# Patient Record
Sex: Female | Born: 1965
Health system: Southern US, Community
[De-identification: ages and names within clinical notes are randomized; demographics above are authoritative.]

## PROBLEM LIST (undated history)

## (undated) DIAGNOSIS — E039 Hypothyroidism, unspecified: Secondary | ICD-10-CM

## (undated) DIAGNOSIS — E079 Disorder of thyroid, unspecified: Secondary | ICD-10-CM

## (undated) HISTORY — DX: Disorder of thyroid, unspecified: E07.9

## (undated) HISTORY — PX: TUBAL LIGATION: SHX77

## (undated) HISTORY — PX: WISDOM TOOTH EXTRACTION: SHX21

---

## 1999-03-30 ENCOUNTER — Other Ambulatory Visit: Admission: RE | Admit: 1999-03-30 | Discharge: 1999-03-30 | Payer: Self-pay | Admitting: Obstetrics and Gynecology

## 2001-02-09 ENCOUNTER — Other Ambulatory Visit: Admission: RE | Admit: 2001-02-09 | Discharge: 2001-02-09 | Payer: Self-pay | Admitting: Obstetrics and Gynecology

## 2001-02-09 ENCOUNTER — Encounter (INDEPENDENT_AMBULATORY_CARE_PROVIDER_SITE_OTHER): Payer: Self-pay | Admitting: Specialist

## 2001-02-22 ENCOUNTER — Encounter: Payer: Self-pay | Admitting: Obstetrics and Gynecology

## 2001-02-22 ENCOUNTER — Encounter (INDEPENDENT_AMBULATORY_CARE_PROVIDER_SITE_OTHER): Payer: Self-pay

## 2001-02-22 ENCOUNTER — Ambulatory Visit (HOSPITAL_COMMUNITY): Admission: AD | Admit: 2001-02-22 | Discharge: 2001-02-22 | Payer: Self-pay | Admitting: Obstetrics and Gynecology

## 2002-12-27 ENCOUNTER — Other Ambulatory Visit: Admission: RE | Admit: 2002-12-27 | Discharge: 2002-12-27 | Payer: Self-pay | Admitting: Obstetrics and Gynecology

## 2007-04-19 ENCOUNTER — Encounter: Admission: RE | Admit: 2007-04-19 | Discharge: 2007-04-19 | Payer: Self-pay | Admitting: Family Medicine

## 2008-09-30 ENCOUNTER — Encounter: Admission: RE | Admit: 2008-09-30 | Discharge: 2008-09-30 | Payer: Self-pay | Admitting: Family Medicine

## 2011-01-22 NOTE — Op Note (Signed)
[facility]  Patient:    Kathleen Huffman, Kathleen Huffman                       MRN: [id number] Proc. Date: 02/22/01 Adm. Date:  [id number] Attending:  Maxie Better                           Operative Report  DATE OF BIRTH:                12-Jun-1966  PREOPERATIVE DIAGNOSIS:       Left ectopic pregnancy, probably ruptured. Desires tubal sterilization.  POSTOPERATIVE DIAGNOSES:      1. Left ectopic pregnancy, probably ruptured.                                  Desires tubal sterilization.                               2. Hemoperitoneum.                               3. Confirmed ruptured left tubal pregnancy.  ANESTHESIOLOGIST:             Octaviano Glow. Pamalee Leyden, M.D.  OPERATION:                    Left salpingectomy by laparoscopy with right tubal sterilization by cauterization.  SURGEON:                      Genia Del, M.D.  ASSISTANT:                    Sheronette A. Cherly Hensen, M.D.  DESCRIPTION OF PROCEDURE:     Under general anesthesia with endotracheal intubation and the patient in the lithotomy position, she was prepped with Betadine in the abdominal, suprapubic, vulvar and vaginal areas.  Bladder catheterization was done and the patient was draped as usual.  Vaginal exam revealed an anteverted uterus of normal volume.  No right adnexal mass.  On the left side, a mass measuring about 4 x 5 cm was felt.  The cervix and vagina were normal.  A speculum was introduced.  The anterior lip of the cervix was grasped with a tenaculum and the uterus was cannulated.  We removed the speculum.  Abdominally, an infraumbilical incision was done with a scalpel over 10 mm.  A Veress needle was introduced while raising the abdominal wall. Security tests were done and a pneumoperitoneum was created with approximately 3.5 L of CO2.  Once achieved, the Veress needle was removed.  The trocar and laparoscope were introduced.  The suprapubic area was free.  A  contralateral incision was done at that level over 10 mm with a scalpel.  A 10 mm trocar was introduced.  A probe was inserted.  Inspection of the abdominal cavity revealed no pathology.  Inspection of the pelvic cavity revealed hemoperitoneum of about 250 cc.  The uterus was normal in volume and appearance.  The right tube and the right ovary were normal.  The left adnexa presented a big blood clot on the left tube. No active bleeding was seen.  We removed the probe and inserted the large Nezhat.  We irrigated  and suctioned the pelvic cavity.  The clot on the left tube was suctioned.  We then saw a ruptured left ampullary tubal pregnancy measuring about 4 cm in diameter and a partially ruptured tube.  We used the tripolar to cauterize and cut successively the tube at the cornua.  We then followed under the tube and the mesosalpinx unto the tube with the ectopic pregnancy was completely removed. We used an Endobag and the specimen was then sent to pathology.  We then irrigated and suctioned again.  Hemostasis was adequate at the level of the left adnexa.  We then proceeded with the right tubal sterilization using the tripolar, cauterizing the tube at about 2 cm from the cornua over a length of about 2 cm and reaching the mesosalpinx.  We then irrigated for the last time and suctioned.  Hemostasis was adequate.  Pictures were taken before and after the surgery.  We then removed the instruments and evacuated the CO2. Estimated hemoperitoneum present at the beginning of surgery was 250 cc. Estimated blood loss during the case was about 50 cc, for a total of 300 cc. No complications occurred.  The patient was sent to the recovery room in good status.  Rh is pending.  If Rh negative, RhoGAM will be given. DD:  02/22/01 TD:  02/22/01 Job: [id number]

## 2015-01-07 ENCOUNTER — Ambulatory Visit: Payer: Self-pay | Admitting: Family Medicine

## 2015-02-07 ENCOUNTER — Ambulatory Visit: Payer: Self-pay | Admitting: Family Medicine

## 2015-02-27 LAB — VITAMIN B12: VITAMIN B12: 1455

## 2015-02-27 LAB — VITAMIN D 25 HYDROXY (VIT D DEFICIENCY, FRACTURES): Vit D, 25-Hydroxy: 59

## 2015-02-27 LAB — THYROXINE (T4) FREE, DIRECT: THYROXINE (T4): 0.88

## 2015-02-27 LAB — T3, FREE: Triiodothyronine, Free, Serum: 4

## 2015-02-27 LAB — HEMOGLOBIN A1C: Hgb A1c MFr Bld: 5.4 % (ref 4.0–6.0)

## 2015-03-17 ENCOUNTER — Ambulatory Visit: Payer: Self-pay | Admitting: Family Medicine

## 2015-03-21 ENCOUNTER — Encounter: Payer: Self-pay | Admitting: Family Medicine

## 2015-03-21 ENCOUNTER — Ambulatory Visit (INDEPENDENT_AMBULATORY_CARE_PROVIDER_SITE_OTHER): Payer: BLUE CROSS/BLUE SHIELD | Admitting: Family Medicine

## 2015-03-21 VITALS — BP 109/67 | HR 71 | Ht 63.5 in | Wt 178.0 lb

## 2015-03-21 DIAGNOSIS — E039 Hypothyroidism, unspecified: Secondary | ICD-10-CM

## 2015-03-21 DIAGNOSIS — E6609 Other obesity due to excess calories: Secondary | ICD-10-CM | POA: Insufficient documentation

## 2015-03-21 DIAGNOSIS — R2230 Localized swelling, mass and lump, unspecified upper limb: Secondary | ICD-10-CM | POA: Insufficient documentation

## 2015-03-21 DIAGNOSIS — R2231 Localized swelling, mass and lump, right upper limb: Secondary | ICD-10-CM | POA: Diagnosis not present

## 2015-03-21 DIAGNOSIS — E669 Obesity, unspecified: Secondary | ICD-10-CM

## 2015-03-21 LAB — TSH: TSH: 0.241 u[IU]/mL — ABNORMAL LOW (ref 0.350–4.500)

## 2015-03-21 LAB — LDL CHOLESTEROL, DIRECT: Direct LDL: 153 mg/dL — ABNORMAL HIGH

## 2015-03-21 NOTE — Assessment & Plan Note (Signed)
Check TSH 

## 2015-03-21 NOTE — Assessment & Plan Note (Signed)
Direct LDL, CMP labs pending.

## 2015-03-21 NOTE — Progress Notes (Signed)
Kathleen Huffman is a 49 y.o. female who [facility] [place] for establish care. She feels well with medical problems as well as significant for thyroid disease managed by integrative health with natural thyroid hormone. She does however note any acute problem. She is a several month history of a small mass in her right axilla. This is not changed in months. A friend of hers who is a PA thinks it's a lipoma. She denies any weight loss fevers chills night sweats nausea vomiting or diarrhea. She has not tried any treatment yet.   History reviewed. No pertinent past medical history. Past Surgical History  Procedure Laterality Date  . Tubal ligation     History  Substance Use Topics  . Smoking status: Former Games developermoker  . Smokeless tobacco: Not on file  . Alcohol Use: 0.0 oz/week    0 Standard drinks or equivalent per week   ROS as above Medications: Current Outpatient Prescriptions  Medication Sig Dispense Refill  . AMBULATORY NON FORMULARY MEDICATION pronegnolene    . B Complex Vitamins (VITAMIN B COMPLEX PO) Take by mouth.    . Cyanocobalamin (VITAMIN B-12 PO) Take by mouth.    . Magnesium 100 MG CAPS Take by mouth.    . THYROID PO Take by mouth.     No current facility-administered medications for this visit.   Allergies  Allergen Reactions  . Sulfa Antibiotics Hives     Exam:  BP 109/67 mmHg  Pulse 71  Ht 5' 3.5" (1.613 m)  Wt 178 lb (80.74 kg)  BMI 31.03 kg/m2 Gen: Well NAD HEENT: EOMI,  MMM Lungs: Normal work of breathing. CTABL Heart: RRR no MRG Abd: NABS, Soft. Nondistended, Nontender Exts: Brisk capillary refill, warm and well perfused.  Lymph: No cervical lymphadenopathy bilaterally. No left axillary lymphadenopathy. Right axilla small mobile nontender mass less than 1 cm. No skin changes.  No results found for this or any previous visit (from the past 24 hour(s)). No results found.   Please see individual assessment  and plan sections.

## 2015-03-21 NOTE — Assessment & Plan Note (Signed)
Likely lipoma. Watchful waiting is changing we'll perform ultrasound and excisional biopsy.

## 2015-03-21 NOTE — Patient Instructions (Signed)
Thank you for coming in today. Return in one year. Return sooner if the mass changes. Call or go to the emergency room if you get worse, have trouble breathing, have chest pains, or palpitations.

## 2015-06-06 ENCOUNTER — Encounter: Payer: Self-pay | Admitting: Family Medicine

## 2015-06-09 ENCOUNTER — Ambulatory Visit (INDEPENDENT_AMBULATORY_CARE_PROVIDER_SITE_OTHER): Payer: BLUE CROSS/BLUE SHIELD | Admitting: Family Medicine

## 2015-06-09 ENCOUNTER — Encounter: Payer: Self-pay | Admitting: Family Medicine

## 2015-06-09 VITALS — BP 135/82 | HR 69 | Temp 98.1°F | Wt 177.0 lb

## 2015-06-09 DIAGNOSIS — E669 Obesity, unspecified: Secondary | ICD-10-CM | POA: Diagnosis not present

## 2015-06-09 DIAGNOSIS — R599 Enlarged lymph nodes, unspecified: Secondary | ICD-10-CM

## 2015-06-09 DIAGNOSIS — R590 Localized enlarged lymph nodes: Secondary | ICD-10-CM

## 2015-06-09 DIAGNOSIS — E039 Hypothyroidism, unspecified: Secondary | ICD-10-CM

## 2015-06-09 NOTE — Progress Notes (Signed)
Kathleen Huffman is a 49 y.o. female who presents to [name]  today for lymph node. Patient has a swollen nodule on the right posterior scalp. This is been present for 10 days. It is not particularly tender and she denies any other scalp issues. She feels well otherwise with no fevers chills nausea vomiting or diarrhea. She feels well with no issues today.   Past Medical History  Diagnosis Date  . Thyroid disease    Past Surgical History  Procedure Laterality Date  . Tubal ligation     Social History  Substance Use Topics  . Smoking status: Former Games developer  . Smokeless tobacco: Not on file  . Alcohol Use: 0.0 oz/week    0 Standard drinks or equivalent per week   family history includes Alcohol abuse in her brother and father; Cancer in her maternal grandmother; Diabetes in her maternal uncle; Hyperlipidemia in her father and maternal grandfather; Hypertension in her mother.  ROS as above Medications: Current Outpatient Prescriptions  Medication Sig Dispense Refill  . AMBULATORY NON FORMULARY MEDICATION pronegnolene    . B Complex Vitamins (VITAMIN B COMPLEX PO) Take by mouth.    . Cyanocobalamin (VITAMIN B-12 PO) Take by mouth.    . Magnesium 100 MG CAPS Take by mouth.    . THYROID PO Take by mouth.     No current facility-administered medications for this visit.   Allergies  Allergen Reactions  . Sulfa Antibiotics Hives     Exam:  BP 135/82 mmHg  Pulse 69  Temp(Src) 98.1 F (36.7 C) (Oral)  Wt 177 lb (80.287 kg) Gen: Well NAD HEENT: EOMI,  MMM no scalp lesions Lungs: Normal work of breathing. CTABL Heart: RRR no MRG Abd: NABS, Soft. Nondistended, Nontender Exts: Brisk capillary refill, warm and well perfused.  Skin: Small mobile nontender less than 1 cm nodule at the posterior scalp consistent with lymph node  No results found for this or any previous visit (from the past 24 hour(s)). No results found.   Please see  individual assessment and plan sections.

## 2015-06-09 NOTE — Assessment & Plan Note (Signed)
Likely cervical lymph node. Plan for CBC and watchful waiting.

## 2015-06-09 NOTE — Assessment & Plan Note (Signed)
TSH lower last visit. Recheck TSH today.

## 2015-06-09 NOTE — Patient Instructions (Signed)
Thank you for coming in today. Get fasting labs in the near future.  Return if the mass is not getting better or is getting worse.  I will call with results.

## 2015-06-09 NOTE — Assessment & Plan Note (Signed)
Tract LDL elevated last time. Recheck fasting labs today.

## 2015-06-13 LAB — CBC WITH DIFFERENTIAL/PLATELET
BASOS ABS: 0.1 10*3/uL (ref 0.0–0.1)
BASOS PCT: 1 % (ref 0–1)
EOS ABS: 0.1 10*3/uL (ref 0.0–0.7)
EOS PCT: 2 % (ref 0–5)
HCT: 42.4 % (ref 36.0–46.0)
Hemoglobin: 14.5 g/dL (ref 12.0–15.0)
Lymphocytes Relative: 38 % (ref 12–46)
Lymphs Abs: 2.6 10*3/uL (ref 0.7–4.0)
MCH: 31.5 pg (ref 26.0–34.0)
MCHC: 34.2 g/dL (ref 30.0–36.0)
MCV: 92 fL (ref 78.0–100.0)
MPV: 10.5 fL (ref 8.6–12.4)
Monocytes Absolute: 0.8 10*3/uL (ref 0.1–1.0)
Monocytes Relative: 12 % (ref 3–12)
Neutro Abs: 3.2 10*3/uL (ref 1.7–7.7)
Neutrophils Relative %: 47 % (ref 43–77)
PLATELETS: 274 10*3/uL (ref 150–400)
RBC: 4.61 MIL/uL (ref 3.87–5.11)
RDW: 13.1 % (ref 11.5–15.5)
WBC: 6.8 10*3/uL (ref 4.0–10.5)

## 2015-06-13 LAB — COMPREHENSIVE METABOLIC PANEL
ALK PHOS: 58 U/L (ref 33–115)
ALT: 11 U/L (ref 6–29)
AST: 14 U/L (ref 10–35)
Albumin: 4.5 g/dL (ref 3.6–5.1)
BILIRUBIN TOTAL: 0.5 mg/dL (ref 0.2–1.2)
BUN: 17 mg/dL (ref 7–25)
CO2: 24 mmol/L (ref 20–31)
Calcium: 9.7 mg/dL (ref 8.6–10.2)
Chloride: 100 mmol/L (ref 98–110)
Creat: 0.8 mg/dL (ref 0.50–1.10)
GLUCOSE: 84 mg/dL (ref 65–99)
Potassium: 4.5 mmol/L (ref 3.5–5.3)
Sodium: 136 mmol/L (ref 135–146)
Total Protein: 7.5 g/dL (ref 6.1–8.1)

## 2015-06-13 LAB — LIPID PANEL
Cholesterol: 218 mg/dL — ABNORMAL HIGH (ref 125–200)
HDL: 41 mg/dL — ABNORMAL LOW (ref >=46)
LDL CALC: 145 mg/dL — AB (ref <130)
Total CHOL/HDL Ratio: 5.3 Ratio — ABNORMAL HIGH (ref <=5.0)
Triglycerides: 160 mg/dL — ABNORMAL HIGH (ref <150)
VLDL: 32 mg/dL — ABNORMAL HIGH (ref <30)

## 2015-06-13 LAB — TSH: TSH: 0.392 u[IU]/mL (ref 0.350–4.500)

## 2015-06-16 MED ORDER — ATORVASTATIN CALCIUM 40 MG PO TABS: 40.0000 mg | ORAL_TABLET | Freq: Every day | ORAL | Status: DC

## 2015-06-16 NOTE — Progress Notes (Signed)
Quick Note:  Cholesterol is very bad. Start Lipitor. Recheck in 1 month. ______

## 2015-06-16 NOTE — Addendum Note (Signed)
Addended by: Rodolph Bong on: 06/16/2015 08:11 AM   Modules accepted: Orders

## 2015-11-27 ENCOUNTER — Encounter (HOSPITAL_COMMUNITY): Payer: Self-pay | Admitting: *Deleted

## 2015-11-27 ENCOUNTER — Other Ambulatory Visit: Payer: Self-pay | Admitting: Obstetrics and Gynecology

## 2015-12-10 ENCOUNTER — Encounter (HOSPITAL_COMMUNITY)
Admission: RE | Disposition: A | Discharge status: Home / Self Care | Payer: Self-pay | Source: Ambulatory Visit | Attending: Obstetrics and Gynecology

## 2015-12-10 ENCOUNTER — Ambulatory Visit (HOSPITAL_COMMUNITY): Payer: BLUE CROSS/BLUE SHIELD | Admitting: Anesthesiology

## 2015-12-10 ENCOUNTER — Ambulatory Visit (HOSPITAL_COMMUNITY)
Admission: RE | Admit: 2015-12-10 | Discharge: 2015-12-10 | Disposition: A | Discharge status: Home / Self Care | Payer: BLUE CROSS/BLUE SHIELD | Source: Ambulatory Visit | Attending: Obstetrics and Gynecology | Admitting: Obstetrics and Gynecology

## 2015-12-10 ENCOUNTER — Encounter (HOSPITAL_COMMUNITY): Payer: Self-pay

## 2015-12-10 DIAGNOSIS — N939 Abnormal uterine and vaginal bleeding, unspecified: Secondary | ICD-10-CM | POA: Insufficient documentation

## 2015-12-10 DIAGNOSIS — Z882 Allergy status to sulfonamides status: Secondary | ICD-10-CM | POA: Insufficient documentation

## 2015-12-10 DIAGNOSIS — Z87891 Personal history of nicotine dependence: Secondary | ICD-10-CM | POA: Diagnosis not present

## 2015-12-10 DIAGNOSIS — E039 Hypothyroidism, unspecified: Secondary | ICD-10-CM | POA: Insufficient documentation

## 2015-12-10 HISTORY — DX: Hypothyroidism, unspecified: E03.9

## 2015-12-10 HISTORY — PX: DILATATION & CURETTAGE/HYSTEROSCOPY WITH MYOSURE: SHX6511

## 2015-12-10 LAB — CBC
HCT: 41.1 % (ref 36.0–46.0)
HEMOGLOBIN: 14 g/dL (ref 12.0–15.0)
MCH: 31.9 pg (ref 26.0–34.0)
MCHC: 34.1 g/dL (ref 30.0–36.0)
MCV: 93.6 fL (ref 78.0–100.0)
PLATELETS: 344 10*3/uL (ref 150–400)
RBC: 4.39 MIL/uL (ref 3.87–5.11)
RDW: 13.3 % (ref 11.5–15.5)
WBC: 7.1 10*3/uL (ref 4.0–10.5)

## 2015-12-10 SURGERY — DILATATION & CURETTAGE/HYSTEROSCOPY WITH MYOSURE
Anesthesia: General | Site: Vagina

## 2015-12-10 MED ORDER — PROPOFOL 10 MG/ML IV BOLUS
INTRAVENOUS | Status: AC
  Filled 2015-12-10: qty 20

## 2015-12-10 MED ORDER — KETOROLAC TROMETHAMINE 30 MG/ML IJ SOLN: 30.0000 mg | Freq: Once | INTRAMUSCULAR | Status: DC

## 2015-12-10 MED ORDER — BUPIVACAINE HCL (PF) 0.5 % IJ SOLN
INTRAMUSCULAR | Status: AC
Start: 2015-12-10 — End: 2015-12-10
  Filled 2015-12-10: qty 30

## 2015-12-10 MED ORDER — VASOPRESSIN 20 UNIT/ML IV SOLN
INTRAVENOUS | Status: AC
  Filled 2015-12-10: qty 1

## 2015-12-10 MED ORDER — SODIUM CHLORIDE 0.9 % IR SOLN
Status: DC | PRN
  Administered 2015-12-10: 3000 mL

## 2015-12-10 MED ORDER — KETOROLAC TROMETHAMINE 30 MG/ML IJ SOLN
INTRAMUSCULAR | Status: AC
  Filled 2015-12-10: qty 1

## 2015-12-10 MED ORDER — SODIUM CHLORIDE 0.9 % IJ SOLN
INTRAMUSCULAR | Status: DC | PRN
  Administered 2015-12-10: 10 mL via INTRAVENOUS

## 2015-12-10 MED ORDER — ONDANSETRON HCL 4 MG/2ML IJ SOLN
INTRAMUSCULAR | Status: AC
  Filled 2015-12-10: qty 2

## 2015-12-10 MED ORDER — KETOROLAC TROMETHAMINE 30 MG/ML IJ SOLN
INTRAMUSCULAR | Status: DC | PRN
  Administered 2015-12-10: 30 mg via INTRAVENOUS

## 2015-12-10 MED ORDER — LACTATED RINGERS IV SOLN
INTRAVENOUS | Status: DC
  Administered 2015-12-10: 10 mL/h via INTRAVENOUS
  Administered 2015-12-10: 13:00:00 via INTRAVENOUS

## 2015-12-10 MED ORDER — VASOPRESSIN 20 UNIT/ML IV SOLN
INTRAVENOUS | Status: DC | PRN
  Administered 2015-12-10: 20 [IU]

## 2015-12-10 MED ORDER — CEFAZOLIN SODIUM-DEXTROSE 2-3 GM-% IV SOLR
INTRAVENOUS | Status: AC
  Filled 2015-12-10: qty 50

## 2015-12-10 MED ORDER — LIDOCAINE HCL (CARDIAC) 20 MG/ML IV SOLN
INTRAVENOUS | Status: DC | PRN
  Administered 2015-12-10: 50 mg via INTRAVENOUS

## 2015-12-10 MED ORDER — SCOPOLAMINE 1 MG/3DAYS TD PT72
1.0000 | MEDICATED_PATCH | Freq: Once | TRANSDERMAL | Status: DC
  Administered 2015-12-10: 1.5 mg via TRANSDERMAL

## 2015-12-10 MED ORDER — FENTANYL CITRATE (PF) 100 MCG/2ML IJ SOLN
INTRAMUSCULAR | Status: AC
  Filled 2015-12-10: qty 2

## 2015-12-10 MED ORDER — BUPIVACAINE HCL (PF) 0.25 % IJ SOLN
INTRAMUSCULAR | Status: DC | PRN
  Administered 2015-12-10: 20 mL

## 2015-12-10 MED ORDER — SODIUM CHLORIDE 0.9 % IJ SOLN
INTRAMUSCULAR | Status: AC
  Filled 2015-12-10: qty 50

## 2015-12-10 MED ORDER — ONDANSETRON HCL 4 MG/2ML IJ SOLN
INTRAMUSCULAR | Status: DC | PRN
  Administered 2015-12-10: 4 mg via INTRAVENOUS

## 2015-12-10 MED ORDER — DEXAMETHASONE SODIUM PHOSPHATE 10 MG/ML IJ SOLN
INTRAMUSCULAR | Status: DC | PRN
  Administered 2015-12-10: 4 mg via INTRAVENOUS

## 2015-12-10 MED ORDER — OXYCODONE-ACETAMINOPHEN 5-325 MG PO TABS: 1.0000 | ORAL_TABLET | ORAL | Status: DC | PRN

## 2015-12-10 MED ORDER — HYDROMORPHONE HCL 1 MG/ML IJ SOLN: 0.2500 mg | INTRAMUSCULAR | Status: DC | PRN

## 2015-12-10 MED ORDER — MIDAZOLAM HCL 2 MG/2ML IJ SOLN
INTRAMUSCULAR | Status: DC | PRN
  Administered 2015-12-10: 2 mg via INTRAVENOUS

## 2015-12-10 MED ORDER — FENTANYL CITRATE (PF) 100 MCG/2ML IJ SOLN
INTRAMUSCULAR | Status: DC | PRN
  Administered 2015-12-10: 50 ug via INTRAVENOUS

## 2015-12-10 MED ORDER — BUPIVACAINE HCL (PF) 0.25 % IJ SOLN
INTRAMUSCULAR | Status: AC
Start: 2015-12-10 — End: 2015-12-10
  Filled 2015-12-10: qty 30

## 2015-12-10 MED ORDER — CEFAZOLIN SODIUM-DEXTROSE 2-4 GM/100ML-% IV SOLN
2.0000 g | INTRAVENOUS | Status: AC
  Administered 2015-12-10: 2 g via INTRAVENOUS
  Filled 2015-12-10: qty 100

## 2015-12-10 MED ORDER — SCOPOLAMINE 1 MG/3DAYS TD PT72
MEDICATED_PATCH | TRANSDERMAL | Status: AC
  Administered 2015-12-10: 1.5 mg via TRANSDERMAL
  Filled 2015-12-10: qty 1

## 2015-12-10 MED ORDER — MIDAZOLAM HCL 2 MG/2ML IJ SOLN
INTRAMUSCULAR | Status: AC
  Filled 2015-12-10: qty 2

## 2015-12-10 MED ORDER — DEXAMETHASONE SODIUM PHOSPHATE 4 MG/ML IJ SOLN
INTRAMUSCULAR | Status: AC
  Filled 2015-12-10: qty 1

## 2015-12-10 MED ORDER — HYDROCODONE-ACETAMINOPHEN 7.5-325 MG PO TABS: 1.0000 | ORAL_TABLET | Freq: Once | ORAL | Status: DC | PRN

## 2015-12-10 MED ORDER — PROPOFOL 10 MG/ML IV BOLUS
INTRAVENOUS | Status: DC | PRN
  Administered 2015-12-10: 200 mg via INTRAVENOUS

## 2015-12-10 SURGICAL SUPPLY — 19 items
CANISTER SUCT 3000ML (MISCELLANEOUS) ×2 IMPLANT
CATH ROBINSON RED A/P 16FR (CATHETERS) ×2 IMPLANT
CLOTH BEACON ORANGE TIMEOUT ST (SAFETY) ×2 IMPLANT
CONTAINER PREFILL 10% NBF 60ML (FORM) ×4 IMPLANT
DEVICE MYOSURE LITE (MISCELLANEOUS) IMPLANT
DEVICE MYOSURE REACH (MISCELLANEOUS) ×2 IMPLANT
FILTER ARTHROSCOPY CONVERTOR (FILTER) ×2 IMPLANT
GLOVE BIO SURGEON STRL SZ7.5 (GLOVE) ×2 IMPLANT
GLOVE BIOGEL PI IND STRL 7.0 (GLOVE) ×1 IMPLANT
GLOVE BIOGEL PI INDICATOR 7.0 (GLOVE) ×1
GOWN STRL REUS W/TWL LRG LVL3 (GOWN DISPOSABLE) ×6 IMPLANT
PACK VAGINAL MINOR WOMEN LF (CUSTOM PROCEDURE TRAY) ×2 IMPLANT
PAD OB MATERNITY 4.3X12.25 (PERSONAL CARE ITEMS) ×2 IMPLANT
SEAL ROD LENS SCOPE MYOSURE (ABLATOR) ×2 IMPLANT
SYR TB 1ML 25GX5/8 (SYRINGE) IMPLANT
TOWEL OR 17X24 6PK STRL BLUE (TOWEL DISPOSABLE) ×4 IMPLANT
TUBING AQUILEX INFLOW (TUBING) ×2 IMPLANT
TUBING AQUILEX OUTFLOW (TUBING) ×2 IMPLANT
WATER STERILE IRR 1000ML POUR (IV SOLUTION) ×2 IMPLANT

## 2015-12-10 NOTE — Transfer of Care (Signed)
Immediate Anesthesia Transfer of Care Note  Patient: Kathleen MonarchKimberly L Samson  Procedure(s) Performed: Procedure(s): DILATATION & CURETTAGE/HYSTEROSCOPY WITH MYOSURE (N/A)  Patient Location: PACU  Anesthesia Type:General  Level of Consciousness: awake, alert  and oriented  Airway & Oxygen Therapy: Patient Spontanous Breathing and Patient connected to nasal cannula oxygen  Post-op Assessment: Report given to RN and Post -op Vital signs reviewed and stable  Post vital signs: Reviewed and stable  Last Vitals:  Filed Vitals:   12/10/15 1133  BP: 127/76  Pulse: 71  Temp: 36.6 C  Resp: 16    Complications: No apparent anesthesia complications

## 2015-12-10 NOTE — H&P (Signed)
NAMRicharda Osmond:  Sabia, Kelseigh              ACCOUNT NO.:  [id number]  MEDICAL RECORD NO.:  [id number]  LOCATION:  PERIO                         FACILITY:  [facility]  PHYSICIAN:  Lenoard Adenichard J. Misty Foutz, M.D.DATE OF BIRTH:  28-May-1966  DATE OF ADMISSION:  11/26/2015 DATE OF DISCHARGE:                             HISTORY & PHYSICAL   PREOPERATIVE DIAGNOSIS:  Abnormal uterine bleeding.  HISTORY OF PRESENT ILLNESS:  A 50 year old white female, G3, P1, who presents with abnormal bleeding and structural lesions as noted by salines on hysterogram.  ALLERGIES:  She has allergies to SULFA, CODEINE, LATEX.  FAMILY HISTORY:  Bladder cancer, heart disease, lung cancer, hypertension, breast cancer.  SOCIAL HISTORY:  She is a nonsmoker, nondrinker.  She denies domestic or physical violence.  She has a history of vaginal delivery x1.  Ectopic pregnancy treated medically x1.  MEDICATIONS:  Include: 1. Aygestin. 2. Zithromax. 3. Tamiflu. 4. Vitamin D. 5. Thyroid supplement medication.  PHYSICAL EXAMINATION:  GENERAL:  Well-developed, well-nourished white female, in no acute distress. HEENT:  Normal. NECK:  Supple.  Full range of motion. LUNGS:  Clear. HEART:  Regular rate and rhythm. ABDOMEN:  Soft, nontender. PELVIC:  Reveals a normal-sized, slightly bulky uterus with no adnexal masses.  The uterus is retroflexed.  IMPRESSION:  Abnormal uterine bleeding, questionable structural lesion.  PLAN:  Proceed with diagnostic hysteroscopy, D and C, and MyoSure.  The risks of anesthesia, infection, bleeding, injury to surrounding organs, possible need for repair were discussed.  Delayed versus immediate complications to include bowel and bladder injury noted.  The patient acknowledges and wishes to proceed.     Lenoard Adenichard J. Adrick Kestler, M.D.     RJT/MEDQ  D:  12/10/2015  T:  12/10/2015  Job:  [id number]

## 2015-12-10 NOTE — Anesthesia Preprocedure Evaluation (Addendum)
Anesthesia Evaluation  Patient identified by MRN, date of birth, ID band Patient awake    Reviewed: Allergy & Precautions, NPO status , Patient's Chart, lab work & pertinent test results  Airway Mallampati: II  TM Distance: >3 FB Neck ROM: Full    Dental   Pulmonary former smoker,    breath sounds clear to auscultation       Cardiovascular negative cardio ROS   Rhythm:Regular Rate:Normal     Neuro/Psych negative neurological ROS     GI/Hepatic negative GI ROS, Neg liver ROS,   Endo/Other  Hypothyroidism   Renal/GU negative Renal ROS     Musculoskeletal negative musculoskeletal ROS (+)   Abdominal   Peds  Hematology negative hematology ROS (+)   Anesthesia Other Findings   Reproductive/Obstetrics                           Lab Results  Component Value Date   WBC 7.1 12/10/2015   HGB 14.0 12/10/2015   HCT 41.1 12/10/2015   MCV 93.6 12/10/2015   PLT 344 12/10/2015   Lab Results  Component Value Date   CREATININE 0.80 06/12/2015   BUN 17 06/12/2015   NA 136 06/12/2015   K 4.5 06/12/2015   CL 100 06/12/2015   CO2 24 06/12/2015    Anesthesia Physical Anesthesia Plan  ASA: II  Anesthesia Plan: General   Post-op Pain Management:    Induction: Intravenous  Airway Management Planned: LMA  Additional Equipment:   Intra-op Plan:   Post-operative Plan: Extubation in OR  Informed Consent: I have reviewed the patients History and Physical, chart, labs and discussed the procedure including the risks, benefits and alternatives for the proposed anesthesia with the patient or authorized representative who has indicated his/her understanding and acceptance.     Plan Discussed with: CRNA  Anesthesia Plan Comments:         Anesthesia Quick Evaluation

## 2015-12-10 NOTE — Progress Notes (Signed)
Patient ID: Kathleen Huffman, female   DOB: Oct 26, 1965, 50 y.o.   MRN: [id number] Patient seen and examined. Consent witnessed and signed. No changes noted. Update completed.

## 2015-12-10 NOTE — Transfer of Care (Signed)
Immediate Anesthesia Transfer of Care Note  Patient: Kathleen Huffman  Procedure(s) Performed: Procedure(s): DILATATION & CURETTAGE/HYSTEROSCOPY WITH MYOSURE (N/A)  Patient Location: PACU  Anesthesia Type:General  Level of Consciousness: awake, alert  and sedated  Airway & Oxygen Therapy: Patient Spontanous Breathing and Patient connected to nasal cannula oxygen  Post-op Assessment: Report given to RN and Post -op Vital signs reviewed and stable  Post vital signs: Reviewed and stable  Last Vitals:  Filed Vitals:   12/10/15 1133  BP: 127/76  Pulse: 71  Temp: 36.6 C  Resp: 16    Complications: No apparent anesthesia complications

## 2015-12-10 NOTE — Op Note (Signed)
12/10/2015  1:45 PM  PATIENT:  Leonette MonarchKimberly L Wogan  50 y.o. female  PRE-OPERATIVE DIAGNOSIS:  Abnormal Uterine Bleeding  POST-OPERATIVE DIAGNOSIS:  Abnormal Uterine Bleeding Endometrial polypoid tissue  PROCEDURE:  Procedure(s): DILATATION & CURETTAGE HYSTEROSCOPY WITH MYOSURE  SURGEON:  Surgeon(s): Olivia Mackieichard Zamirah Denny, MD  ASSISTANTS: none   ANESTHESIA:   local and general  ESTIMATED BLOOD LOSS: * No blood loss amount entered *   DRAINS: none   LOCAL MEDICATIONS USED:  MARCAINE    and Amount: 20 ml  SPECIMEN:  Source of Specimen:  EMC and polypoid tissue  DISPOSITION OF SPECIMEN:  PATHOLOGY  COUNTS:  YES  DICTATION #: [id number]  PLAN OF CARE: DC home  PATIENT DISPOSITION:  PACU - hemodynamically stable.

## 2015-12-10 NOTE — Anesthesia Postprocedure Evaluation (Signed)
Anesthesia Post Note  Patient: Leonette MonarchKimberly L Klippel  Procedure(s) Performed: Procedure(s) (LRB): DILATATION & CURETTAGE/HYSTEROSCOPY WITH MYOSURE (N/A)  Patient location during evaluation: PACU Anesthesia Type: General Level of consciousness: awake and alert Pain management: pain level controlled Vital Signs Assessment: post-procedure vital signs reviewed and stable Respiratory status: spontaneous breathing, nonlabored ventilation, respiratory function stable and patient connected to nasal cannula oxygen Cardiovascular status: blood pressure returned to baseline and stable Postop Assessment: no signs of nausea or vomiting Anesthetic complications: no    Last Vitals:  Filed Vitals:   12/10/15 1445 12/10/15 1525  BP: 118/70 147/82  Pulse: 75 70  Temp: 36.8 C   Resp: 19 20    Last Pain:  Filed Vitals:   12/10/15 1532  PainSc: 2                  Kennieth RadFitzgerald, Dontarious Schaum E

## 2015-12-10 NOTE — Discharge Instructions (Signed)
DISCHARGE INSTRUCTIONS: HYSTEROSCOPY / ENDOMETRIAL ABLATION The following instructions have been prepared to help you care for yourself upon your return home.  May Remove Scop patch on or before 12/13/15.  May take Ibuprofen after 7:30pm today.  May take stool softner while taking narcotic pain medication to prevent constipation.  Drink plenty of water.  Personal hygiene:  Use sanitary pads for vaginal drainage, not tampons.  Shower the day after your procedure.  NO tub baths, pools or Jacuzzis for 2-3 weeks.  Wipe front to back after using the bathroom.  Activity and limitations:  Do NOT drive or operate any equipment for 24 hours. The effects of anesthesia are still present and drowsiness may result.  Do NOT rest in bed all day.  Walking is encouraged.  Walk up and down stairs slowly.  You may resume your normal activity in one to two days or as indicated by your physician. Sexual activity: NO intercourse for at least 2 weeks after the procedure, or as indicated by your Doctor.  Diet: Eat a light meal as desired this evening. You may resume your usual diet tomorrow.  Return to Work: You may resume your work activities in one to two days or as indicated by Therapist, sportsyour Doctor.  What to expect after your surgery: Expect to have vaginal bleeding/discharge for 2-3 days and spotting for up to 10 days. It is not unusual to have soreness for up to 1-2 weeks. You may have a slight burning sensation when you urinate for the first day. Mild cramps may continue for a couple of days. You may have a regular period in 2-6 weeks.  Call your doctor for any of the following:  Excessive vaginal bleeding or clotting, saturating and changing one pad every hour.  Inability to urinate 6 hours after discharge from hospital.  Pain not relieved by pain medication.  Fever of 100.4 F or greater.  Unusual vaginal discharge or odor.  Return to office _________________Call for an appointment  ___________________ Patients signature: ______________________ Nurses signature ________________________  Post Anesthesia Care Unit [phone]

## 2015-12-10 NOTE — Anesthesia Procedure Notes (Signed)
Procedure Name: LMA Insertion Date/Time: 12/10/2015 1:05 PM Performed by: Cleda ClarksBROWDER, Aryannah Mohon R Pre-anesthesia Checklist: Patient identified, Timeout performed, Emergency Drugs available, Suction available and Patient being monitored Patient Re-evaluated:Patient Re-evaluated prior to inductionOxygen Delivery Method: Circle system utilized Preoxygenation: Pre-oxygenation with 100% oxygen Intubation Type: IV induction Ventilation: Mask ventilation without difficulty LMA: LMA inserted LMA Size: 4.0 Tube type: Oral Number of attempts: 1 Placement Confirmation: positive ETCO2 and breath sounds checked- equal and bilateral Tube secured with: Tape Dental Injury: Teeth and Oropharynx as per pre-operative assessment

## 2015-12-11 ENCOUNTER — Encounter (HOSPITAL_COMMUNITY): Payer: Self-pay | Admitting: Obstetrics and Gynecology

## 2015-12-11 NOTE — Op Note (Signed)
NAMRicharda Osmond:  Kenley, Mckynleigh              ACCOUNT NO.:  [id number]  MEDICAL RECORD NO.:  [id number]  LOCATION:  [facility]                          FACILITY:  [facility]  PHYSICIAN:  Lenoard Adenichard J. Thimothy Barretta, M.D.DATE OF BIRTH:  03/01/1966  DATE OF PROCEDURE: DATE OF DISCHARGE:  12/10/2015                              OPERATIVE REPORT   PREOPERATIVE DIAGNOSIS:  Abnormal uterine bleeding with questionable structural lesion.  POSTOPERATIVE DIAGNOSIS:  Abnormal uterine bleeding with questionable structural lesion plus multiple polypoid endometrial areas.  PROCEDURE:  Diagnostic hysteroscopy, D and C, MyoSure resection of endometrial cavity.  SURGEON:  Lenoard Adenichard J. Jessamyn Watterson, MD  ASSISTANT:  None.  ANESTHESIA:  General and local.  ESTIMATED BLOOD LOSS:  Less than 50 mL.  FLUID DEFICIT:  800 mL of saline.  COMPLICATIONS:  None.  DRAINS:  None.  COUNTS:  Correct.  DISPOSITION:  Patient to recovery room in good condition.  BRIEF OPERATIVE NOTE:  After being apprised of risks of anesthesia, infection, bleeding, injury to surrounding organs, possible need for repair, delayed versus immediate complications to include bowel and bladder injury, possible need for repair.  Patient was brought to the operating room, where she was administered general anesthetic without complications, prepped and draped in usual sterile fashion. Catheterized until the bladder was empty.  Exam under anesthesia revealed a bulky retroflexed uterus and no adnexal masses.  Dilute Pitressin solution placed at 3 and 9 o'clock, 20 mL total, at the cervicovaginal junction, dilute Marcaine was placed in the standard fashion 20 mL total 0.25% plain.  At this time, cervix easily dilated up to a #23 Pratt dilator.  Hysteroscope placed.  Visualization reveals thickened endometrium, symmetrically around the cavity, but most prominent along the right anterior fundal area and posterior wall. These areas were resected using the MyoSure REACH in a  standard fashion resecting entire cavity down to the level of the endometrium without difficulty.  Good hemostasis noted.  D and C was performed in a 4- quadrant method using sharp curettage.  Good hemostasis noted.  Pictures taken.  The patient tolerated the procedure well, was awakened, and transferred to recovery in good condition.     Lenoard Adenichard J. Renella Steig, M.D.     RJT/MEDQ  D:  12/10/2015  T:  12/11/2015  Job:  [id number]

## 2016-08-31 LAB — FOLLICLE STIMULATING HORMONE: FSH: 6

## 2016-08-31 LAB — HM MAMMOGRAPHY

## 2016-08-31 LAB — HM PAP SMEAR: HM PAP: NEGATIVE

## 2016-08-31 LAB — TSH: TSH: 1.57 u[IU]/mL (ref 0.41–5.90)

## 2016-10-17 ENCOUNTER — Encounter: Payer: Self-pay | Admitting: Emergency Medicine

## 2016-10-17 ENCOUNTER — Emergency Department
Admission: EM | Admit: 2016-10-17 | Discharge: 2016-10-17 | Disposition: A | Discharge status: Home / Self Care | Payer: BLUE CROSS/BLUE SHIELD | Source: Home / Self Care | Attending: Emergency Medicine | Admitting: Emergency Medicine

## 2016-10-17 ENCOUNTER — Emergency Department (INDEPENDENT_AMBULATORY_CARE_PROVIDER_SITE_OTHER): Payer: BLUE CROSS/BLUE SHIELD

## 2016-10-17 DIAGNOSIS — S86112A Strain of other muscle(s) and tendon(s) of posterior muscle group at lower leg level, left leg, initial encounter: Secondary | ICD-10-CM | POA: Diagnosis not present

## 2016-10-17 DIAGNOSIS — M79661 Pain in right lower leg: Secondary | ICD-10-CM

## 2016-10-17 MED ORDER — IBUPROFEN 800 MG PO TABS: ORAL_TABLET | ORAL | 0 refills | Status: DC

## 2016-10-17 MED ORDER — METAXALONE 800 MG PO TABS: 800.0000 mg | ORAL_TABLET | Freq: Three times a day (TID) | ORAL | 0 refills | Status: DC

## 2016-10-17 MED ORDER — IBUPROFEN 800 MG PO TABS
800.0000 mg | ORAL_TABLET | Freq: Once | ORAL | Status: AC
  Administered 2016-10-17: 800 mg via ORAL

## 2016-10-17 NOTE — ED Triage Notes (Signed)
[facility] she was trying to direct a horse back into the stable. Patient felt a pop and had immediate pain. Rates pain 2/10 and throbbing in nature.

## 2016-10-17 NOTE — ED Provider Notes (Signed)
[facility]    CSN: [id number] Arrival date & time: 10/17/16  1252     History   Chief Complaint Chief Complaint  Patient presents with  . Leg Pain   Here with husband HPI Kathleen Huffman is a 51 y.o. female.   HPI At 12 noon today, she was trying to direct a horseback into the stable. She felt a sudden pop in her right calf and had immediate pain, throbbing without radiation. Intensity is 3 out of 10 at rest, 6 out of 10 with movement. No associated numbness or fever or chills or nausea or vomiting. No chest pain or shortness of breath. 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 Past Medical History:  Diagnosis Date  . Hypothyroidism   . SVD (spontaneous vaginal delivery)    x 1  . Thyroid disease     Patient Active Problem List   Diagnosis Date Noted  . Reactive cervical lymphadenopathy 06/09/2015  . Hypothyroidism 03/21/2015  . Obesity 03/21/2015  . Axillary mass 03/21/2015    Past Surgical History:  Procedure Laterality Date  . DILATATION & CURETTAGE/HYSTEROSCOPY WITH MYOSURE N/A 12/10/2015   Procedure: DILATATION & CURETTAGE/HYSTEROSCOPY WITH MYOSURE;  Surgeon: Olivia Mackie, MD;  Location: [facility];  Service: Gynecology;  Laterality: N/A;  . TUBAL LIGATION    . WISDOM TOOTH EXTRACTION      OB History    No data available       Home Medications    Prior to Admission medications   Medication Sig Start Date End Date Taking? Authorizing Provider  cetirizine (ZYRTEC) 10 MG tablet Take 5 mg by mouth daily.    Historical Provider, MD  Cholecalciferol (VITAMIN D3) 5000 units CAPS Take 1 capsule by mouth daily.    Historical Provider, MD  Cyanocobalamin (VITAMIN B-12 PO) Take 1 tablet by mouth 2 (two) times daily.     Historical Provider, MD  ibuprofen (ADVIL,MOTRIN) 800 MG tablet Take 1 every 8 hours with food as needed for pain 10/17/16   Lajean Manes, MD  metaxalone (SKELAXIN) 800 MG tablet Take 1 tablet (800 mg total) by mouth 3  (three) times daily. As needed for muscle relaxant 10/17/16   Lajean Manes, MD  Omega-3 Fatty Acids (OMEGA 3 PO) Take 1 capsule by mouth daily.    Historical Provider, MD  oxyCODONE-acetaminophen (ROXICET) 5-325 MG tablet Take 1-2 tablets by mouth every 4 (four) hours as needed for severe pain. 12/10/15   Olivia Mackie, MD  thyroid (NATURE-THROID) 65 MG tablet Take 32.5-65 mg by mouth 2 (two) times daily. 65 mg in the morning and 32.5 mg at night    Historical Provider, MD  vitamin C (ASCORBIC ACID) 500 MG tablet Take 500 mg by mouth daily.    Historical Provider, MD    Family History Family History  Problem Relation Age of Onset  . Hypertension Mother   . Alcohol abuse Father   . Hyperlipidemia Father   . Alcohol abuse Brother   . Diabetes Maternal Uncle   . Cancer Maternal Grandmother   . Hyperlipidemia Maternal Grandfather     Social History Social History  Substance Use Topics  . Smoking status: Former Smoker    Packs/day: 1.00    Years: 10.00    Types: Cigarettes    Quit date: 09/06/2000  . Smokeless tobacco: Never Used  . Alcohol use 0.0 oz/week     Comment: occasionally wine     Allergies   Codeine; Sulfa antibiotics; and Latex  Review of Systems Review of Systems  All other systems reviewed and are negative.    Physical Exam Triage Vital Signs ED Triage Vitals  Enc Vitals Group     BP 10/17/16 1414 126/81     Pulse Rate 10/17/16 1414 80     Resp 10/17/16 1414 16     Temp 10/17/16 1414 98 F (36.7 C)     Temp Source 10/17/16 1414 Oral     SpO2 10/17/16 1414 100 %     Weight 10/17/16 1416 180 lb (81.6 kg)     Height 10/17/16 1416 5\' 3"  (1.6 m)     Head Circumference --      Peak Flow --      Pain Score 10/17/16 1417 2     Pain Loc --      Pain Edu? --      Excl. in GC? --    No data found.   Updated Vital Signs BP 126/81 (BP Location: Left Arm)   Pulse 80   Temp 98 F (36.7 C) (Oral)   Resp 16   Ht 5\' 3"  (1.6 m)   Wt 180 lb (81.6 kg)   LMP  10/13/2016   SpO2 100%   BMI 31.89 kg/m    Physical Exam  Constitutional: She is oriented to person, place, and time. She appears well-developed and well-nourished.  In moderate pain from right calf pain. She avoids weightbearing right lower extremity. No cardiorespiratory distress  HENT:  Head: Normocephalic and atraumatic.  Eyes: Pupils are equal, round, and reactive to light. No scleral icterus.  Neck: Normal range of motion. Neck supple.  Cardiovascular: Normal rate and regular rhythm.   Pulmonary/Chest: Effort normal.  Abdominal: She exhibits no distension.  Musculoskeletal:       Right knee: Normal. She exhibits normal range of motion. No tenderness found.       Right ankle: Normal. She exhibits normal range of motion, no swelling and normal pulse. No tenderness. No head of 5th metatarsal and no proximal fibula tenderness found. Achilles tendon normal. Achilles tendon exhibits normal Thompson's test results.       Right lower leg: She exhibits tenderness. She exhibits no laceration.       Legs:      Right foot: Normal. There is normal range of motion, no swelling and normal capillary refill.  Pain in the right medial calf is exacerbated by dorsiflexion and plantar flexion, but range of motion of ankle within normal limits.  Right foot: Normal exam. DP pulses normal. Distal neurovascular intact. Temperature of skin of right foot is normal, equal to the left foot.  Neurological: She is alert and oriented to person, place, and time. No cranial nerve deficit or sensory deficit.  Skin: Skin is warm and dry.  Psychiatric: She has a normal mood and affect. Her behavior is normal.  Vitals reviewed.    UC Treatments / Results  Labs (all labs ordered are listed, but only abnormal results are displayed) Labs Reviewed - No data to display  EKG  EKG Interpretation None       Radiology Dg Tibia/fibula Right  Result Date: 10/17/2016 CLINICAL DATA:  Pt states she was working  with horses today and her right leg gave out. C/o lower leg pain midway down posterior portion of tib/fib. EXAM: RIGHT TIBIA AND FIBULA - 2 VIEW COMPARISON:  None. FINDINGS: There is no evidence of fracture or other focal bone lesions. Soft tissues are unremarkable. IMPRESSION: Negative. Electronically Signed  By: Elige KoHetal  Patel   On: 10/17/2016 13:45    Procedures Procedures (including critical care time)  Medications Ordered in UC Medications  ibuprofen (ADVIL,MOTRIN) tablet 800 mg (800 mg Oral Given 10/17/16 1441)     Initial Impression / Assessment and Plan / UC Course  I have reviewed the triage vital signs and the nursing notes.  Pertinent labs & imaging results that were available during my care of the patient were reviewed by me and considered in my medical decision making (see chart for details).    Final Clinical Impressions(s) / UC Diagnoses   Final diagnoses:  Right calf pain  Gastrocnemius muscle tear, left, initial encounter  X-ray right tib-fib shows no fracture. She likely has a tear of the right gastrocnemius or soleus muscle.  Treatment options discussed, as well as risks, benefits, alternatives. Patient and husband voiced understanding and agreement with the following plans: Here in urgent care, 800 mg ibuprofen given by mouth stat. She declined the option of Toradol IM stat. I offered small amount of tramadol prescription if needed for severe pain, but she declined. She declined any other NSAID prescription other than Motrin, prescribed 800 mg every 8 hours with food if needed for pain. She states the only muscle relaxant that's worked without drowsiness is Skelaxin, so I prescribed Skelaxin, 1 every 8 hours  if needed.  New Prescriptions Discharge Medication List as of 10/17/2016  2:49 PM    START taking these medications   Details  ibuprofen (ADVIL,MOTRIN) 800 MG tablet Take 1 every 8 hours with food as needed for pain, Normal    metaxalone (SKELAXIN) 800  MG tablet Take 1 tablet (800 mg total) by mouth 3 (three) times daily. As needed for muscle relaxant, Starting Sun 10/17/2016, Normal       Encourage rest, ice, compression with ACE bandage, and elevation of injured body part. Ice pack applied. I applied 2 Ace bandages from her ankle up to and including the top of the right lower leg. Long cam walker fitted. Crutches provided. Follow-up with Dr Denyse Amassorey your PCP/sports medicine specialist in 1-2  Days.  Precautions discussed. Red flags discussed. Questions invited and answered. Patient and husband voiced understanding and agreement.       Lajean Manesavid Massey, MD 10/17/16 [phone]

## 2016-10-18 ENCOUNTER — Ambulatory Visit (INDEPENDENT_AMBULATORY_CARE_PROVIDER_SITE_OTHER): Payer: BLUE CROSS/BLUE SHIELD | Admitting: Family Medicine

## 2016-10-18 ENCOUNTER — Encounter: Payer: Self-pay | Admitting: Family Medicine

## 2016-10-18 DIAGNOSIS — S86111A Strain of other muscle(s) and tendon(s) of posterior muscle group at lower leg level, right leg, initial encounter: Secondary | ICD-10-CM | POA: Diagnosis not present

## 2016-10-18 NOTE — Patient Instructions (Signed)
Thank you for coming in today. Continue compression for a few days.  Use cam walker and crutches as needed.  Recheck in 2 weeks or so.    Medial Head Gastrocnemius Tear Medial head gastrocnemius tear, also called tennis leg, is an injury to the inner part of the calf muscle. This injury may include overstretching of the muscle or a partial or complete tear. This is a common sports injury. Calf muscle tears usually occur near the back of the knee. This often causes sudden pain and muscle weakness. What are the causes? This condition is caused by forceful stretching or strain on the calf muscle. This usually happens when you forcefully push off of your foot. It may also happen if you forcefully straighten your knee while your foot is flat on the ground. What increases the risk? The following factors may make you more likely to develop this condition:  Being female and older than age 49.  Playing sports that involve:  Quick increases in speed and changes of direction, such as tennis and soccer.  Jumping, such as basketball.  Running, especially uphill or on uneven ground. What are the signs or symptoms? Symptoms of this condition include:  Sudden pain in the back of the leg when the injury occurs. You may hear a popping sound or feel like you got hit in your calf.  Pain that is made worse by bringing your toes up toward your shin or by straightening your knee.  Pain on the inside of your calf, from your knee to your ankle.  Not being able to rise up on your toes.  Pain when pressing on your calf muscle.  Swelling of your calf.  Bruising along your calf and lower leg, down to your ankle.  Difficulty pushing off your foot when walking or using stairs. How is this diagnosed? This condition may be diagnosed based on:  Your symptoms and medical history.  A physical exam. Your health care provider may be able to feel a lump or a defect in your muscle.  MRI or ultrasound to  determine the severity and exact location of your injury. How is this treated? Treatment for this condition may include:  Resting the muscle and keeping weight off your leg for several days. During this time, you may use crutches or another walking device.  Using a splint to keep your ankle or knee in a stable position.  Wearing a walking boot to decrease the use of your gastrocnemius muscle.  Using a wedge under your heel to reduce stretching of your healing muscle.  Wearing a compression sleeve around your calf muscle.  Icing the muscle.  Raising (elevating) your leg when resting.  Taking medicine for pain and swelling (NSAIDs or steroids).  Doing leg exercises as told by your health care provider or physical therapist. Follow these instructions at home: If you have a splint or compression sleeve:  Wear it as told by your health care provider. Remove it only as told by your health care provider.  Loosen the splint if your toes tingle, become numb, or turn cold and blue.  If your splint or sleeve is not waterproof:  Do not let it get wet.  Protect it with a watertight covering when you take a bath or a shower.  Keep the splint or sleeve clean. Managing pain, stiffness, and swelling  If directed, put ice on the injured area:  Put ice in a plastic bag.  Place a towel between your skin and the  bag.  Leave the ice on for 20 minutes, 2-3 times a day.  Move your toes often to avoid stiffness and to lessen swelling.  Raise (elevate) the injured area above the level of your heart while you are sitting or lying down. Driving  Do not drive or operate heavy machinery while taking prescription pain medicine.  Ask your health care provider when it is safe to drive. Activity  Do not use the injured limb to support your body weight until your health care provider says that you can. Use crutches as told by your health care provider.  Return gradually to your normal  activities as told by your health care provider. Ask your health care provider what activities are safe for you.  Do exercises as told by your health care provider.  Return to sporting activity only as told by your health care provider or physical therapist. Full recovery may take several months. General instructions  Take over-the-counter and prescription medicines only as told by your health care provider.  Keep all follow-up visits as told by your health care provider. This is important. How is this prevented?  Warm up and stretch before being active.  Cool down and stretch after being active.  Give your body time to rest between periods of activity.  Make sure to use equipment that fits you.  Be safe and responsible while being active to avoid falls.  Maintain physical fitness, including:  Strength.  Flexibility. Contact a health care provider if:  Your symptoms do not improve with rest and treatment. Get help right away if:  You have swelling or redness in your calf that is getting worse. This information is not intended to replace advice given to you by your health care provider. Make sure you discuss any questions you have with your health care provider. Document Released: 08/23/2005 Document Revised: 04/27/2016 Document Reviewed: 08/05/2015 Elsevier Interactive Patient Education  2017 [employer]    Medial Head Gastrocnemius Tear Rehab Ask your health care provider which exercises are safe for you. Do exercises exactly as told by your health care provider and adjust them as directed. It is normal to feel mild stretching, pulling, tightness, or discomfort as you do these exercises, but you should stop right away if you feel sudden pain or your pain gets worse.Do not begin these exercises until told by your health care provider. Stretching and range of motion exercises These exercises warm up your muscles and joints and improve the movement and flexibility of your  lower leg. These exercises also help to relieve pain and stiffness. Exercise A: Gastrocnemius stretch 1. Sit with your left / right leg extended. 2. Loop a belt or towel around the ball of your left / right foot. The ball of your foot is on the walking surface, right under your toes. 3. Hold both ends of the belt or towel. 4. Keep your left / right ankle and foot relaxed and keep your knee straight while you use the belt or towel to pull your foot and ankle toward you. Stop at the first point of resistance. 5. Hold this position for __________ seconds. Repeat __________ times. Complete this exercise __________ times a day. Exercise B: Ankle alphabet 1. Sit with your left / right leg supported at the lower leg.  Do not rest your foot on anything.  Make sure your foot has room to move freely. 2. Think of your left / right foot as a paintbrush, and move your foot to trace each letter  of the alphabet in the air. Keep your hip and knee still while you trace. 3. Trace every letter from A to Z. Repeat __________ times. Complete this exercise __________ times a day. Strengthening exercises These exercises build strength and endurance in your lower leg. Endurance is the ability to use your muscles for a long time, even after they get tired. Exercise C: Plantar flexors with band 1. Sit with your left / right leg extended. 2. Loop a rubber exercise band or tube around the ball of your left / right foot. The ball of your foot is on the walking surface, right under your toes. 3. While holding both ends of the band or tube, slowly point your toes downward, pushing them away from you. 4. Hold this position for __________ seconds. 5. Slowly return your foot to the starting position. Repeat __________ times. Complete this exercise __________ times a day. Exercise D: Plantar flexors, standing 1. Stand with your feet shoulder-width apart. 2. Place your hands on a wall or table to steady yourself as needed,  but try not to use it very much for support. 3. Rise up on your toes. 4. If this exercise is too easy, try these options:  Shift your weight toward your left / right leg until you feel challenged.  If told by your health care provider, stand on your left / right foot only. 5. Hold this position for __________ seconds. Repeat __________ times. Complete this exercise __________ times a day. Exercise E: Plantar flexors, eccentric 1. Stand on the balls of your feet on the edge of a step. The ball of your foot is on the walking surface, right under your toes. 2. Place your hands on a wall or railing for balance as needed, but try not to lean on it for support. 3. Rise up on your toes, using both legs to help. 4. Slowly shift all of your weight to your left / right foot and lift your other foot off the step. 5. Slowly lower your left / right heel so it drops below the level of the step. You will feel a slight stretch in your left / right calf. 6. Put your other foot back onto the step. Repeat __________ times. Complete this exercise __________ times a day. This information is not intended to replace advice given to you by your health care provider. Make sure you discuss any questions you have with your health care provider. Document Released: 08/23/2005 Document Revised: 04/29/2016 Document Reviewed: 08/05/2015 Elsevier Interactive Patient Education  2017 [employer].

## 2016-10-18 NOTE — Progress Notes (Signed)
Kathleen Huffman is a 51 y.o. female who presents to [facility] today for right calf injury. Patient injured her right calf yesterday at work at home. 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 She notes pain and swelling was seen in urgent care. She was thought to have a medial gastrocnemius tear and was treated with cam walker boot and Ace wrap. She's feeling a bit better today than yesterday. She denies any swelling or bruising yet with the ace wrap.    Past Medical History:  Diagnosis Date  . Hypothyroidism   . SVD (spontaneous vaginal delivery)    x 1  . Thyroid disease    Past Surgical History:  Procedure Laterality Date  . DILATATION & CURETTAGE/HYSTEROSCOPY WITH MYOSURE N/A 12/10/2015   Procedure: DILATATION & CURETTAGE/HYSTEROSCOPY WITH MYOSURE;  Surgeon: Olivia Mackie, MD;  Location: [facility];  Service: Gynecology;  Laterality: N/A;  . TUBAL LIGATION    . WISDOM TOOTH EXTRACTION     Social History  Substance Use Topics  . Smoking status: Former Smoker    Packs/day: 1.00    Years: 10.00    Types: Cigarettes    Quit date: 09/06/2000  . Smokeless tobacco: Never Used  . Alcohol use 0.0 oz/week     Comment: occasionally wine     ROS:  As above   Medications: Current Outpatient Prescriptions  Medication Sig Dispense Refill  . cetirizine (ZYRTEC) 10 MG tablet Take 5 mg by mouth daily.    . Cholecalciferol (VITAMIN D3) 5000 units CAPS Take 1 capsule by mouth daily.    . Cyanocobalamin (VITAMIN B-12 PO) Take 1 tablet by mouth 2 (two) times daily.     Marland Kitchen ibuprofen (ADVIL,MOTRIN) 800 MG tablet Take 1 every 8 hours with food as needed for pain 21 tablet 0  . metaxalone (SKELAXIN) 800 MG tablet Take 1 tablet (800 mg total) by mouth 3 (three) times daily. As needed for muscle relaxant 21 tablet 0  . Omega-3 Fatty Acids (OMEGA 3 PO) Take 1 capsule by mouth daily.    Marland Kitchen  oxyCODONE-acetaminophen (ROXICET) 5-325 MG tablet Take 1-2 tablets by mouth every 4 (four) hours as needed for severe pain. 30 tablet 0  . thyroid (NATURE-THROID) 65 MG tablet Take 32.5-65 mg by mouth 2 (two) times daily. 65 mg in the morning and 32.5 mg at night    . vitamin C (ASCORBIC ACID) 500 MG tablet Take 500 mg by mouth daily.     No current facility-administered medications for this visit.    Allergies  Allergen Reactions  . Codeine Hives  . Sulfa Antibiotics Hives  . Latex Itching and Rash     Exam:  BP 129/76   Pulse 76   LMP 10/13/2016  General: Well Developed, well nourished, and in no acute distress.  Neuro/Psych: Alert and oriented x3, extra-ocular muscles intact, able to move all 4 extremities, sensation grossly intact. Skin: Warm and dry, no rashes noted.  Respiratory: Not using accessory muscles, speaking in full sentences, trachea midline.  Cardiovascular: Pulses palpable, no extremity edema. Abdomen: Does not appear distended. MSK: right leg is well appearing with no significant swelling or ecchymosis.  The AT is non-tender with no defects.  The medial head of the gastrocnemius is mildly tender but no defects are palpitated.  She has intact extension and flexion strength of the foot.  Sensation, pulses and cap refill  are intact.   Limited MSK US right leg. Intact AT Normal gastroc without obvious defect.  Small hematoma near medial distal gastroc insertion.  Normal bony structures.   No results found for this or any previous visit (from the past 48 hour(s)). 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 C/o lower leg pain midway down posterior portion of tib/fib. EXAM: RIGHT TIBIA AND FIBULA - 2 VIEW COMPARISON:  None. FINDINGS: There is no evidence of fracture or other focal bone lesions. Soft tissues are unremarkable. IMPRESSION: Negative. Electronically Signed   By:  Elige KoHetal  Patel   On: 10/17/2016 13:45      Assessment and Plan: 51 y.o. female with medial head of the gastrocnemius tear vs plantaris tendon tear/rupture.  Exam and ultrasound are largely benign.  Plan to continue ACE wrap for a few days. Will wean out of crutches and cam walker when able guided by pain.  Recheck in about 2 weeks.  Activity as tolerated.    No orders of the defined types were placed in this encounter.   Discussed warning signs or symptoms. Please see discharge instructions. Patient expresses understanding.

## 2016-11-01 ENCOUNTER — Encounter: Payer: Self-pay | Admitting: Family Medicine

## 2016-11-01 ENCOUNTER — Ambulatory Visit (INDEPENDENT_AMBULATORY_CARE_PROVIDER_SITE_OTHER): Payer: BLUE CROSS/BLUE SHIELD | Admitting: Family Medicine

## 2016-11-01 VITALS — BP 125/62 | HR 70 | Wt 177.0 lb

## 2016-11-01 DIAGNOSIS — S86111A Strain of other muscle(s) and tendon(s) of posterior muscle group at lower leg level, right leg, initial encounter: Secondary | ICD-10-CM

## 2016-11-01 NOTE — Progress Notes (Signed)
Kathleen Huffman is a 51 y.o. female who [facility] today for follow up leg pain.   Patient was seen on Feb 12 for strain of the right medial gastrocnemius. She was given a cam walker boot use as needed as well as depression. She notes her pain is significantly improved. She is eager to return to worse writing. She would like some home exercises that she can do on her own.    Past Medical History:  Diagnosis Date  . Hypothyroidism   . SVD (spontaneous vaginal delivery)    x 1  . Thyroid disease    Past Surgical History:  Procedure Laterality Date  . DILATATION & CURETTAGE/HYSTEROSCOPY WITH MYOSURE N/A 12/10/2015   Procedure: DILATATION & CURETTAGE/HYSTEROSCOPY WITH MYOSURE;  Surgeon: Olivia Mackieichard Taavon, MD;  Location: [facility];  Service: Gynecology;  Laterality: N/A;  . TUBAL LIGATION    . WISDOM TOOTH EXTRACTION     Social History  Substance Use Topics  . Smoking status: Former Smoker    Packs/day: 1.00    Years: 10.00    Types: Cigarettes    Quit date: 09/06/2000  . Smokeless tobacco: Never Used  . Alcohol use 0.0 oz/week     Comment: occasionally wine   family history includes Alcohol abuse in her brother and father; Cancer in her maternal grandmother; Diabetes in her maternal uncle; Hyperlipidemia in her father and maternal grandfather; Hypertension in her mother.  ROS as above:  Medications: Current Outpatient Prescriptions  Medication Sig Dispense Refill  . cetirizine (ZYRTEC) 10 MG tablet Take 5 mg by mouth daily.    . Cholecalciferol (VITAMIN D3) 5000 units CAPS Take 1 capsule by mouth daily.    . Cyanocobalamin (VITAMIN B-12 PO) Take 1 tablet by mouth 2 (two) times daily.     Marland Kitchen. ibuprofen (ADVIL,MOTRIN) 800 MG tablet Take 1 every 8 hours with food as needed for pain 21 tablet 0  . metaxalone (SKELAXIN) 800 MG tablet Take 1 tablet (800 mg  total) by mouth 3 (three) times daily. As needed for muscle relaxant 21 tablet 0  . Omega-3 Fatty Acids (OMEGA 3 PO) Take 1 capsule by mouth daily.    Marland Kitchen. oxyCODONE-acetaminophen (ROXICET) 5-325 MG tablet Take 1-2 tablets by mouth every 4 (four) hours as needed for severe pain. 30 tablet 0  . thyroid (NATURE-THROID) 65 MG tablet Take 32.5-65 mg by mouth 2 (two) times daily. 65 mg in the morning and 32.5 mg at night    . vitamin C (ASCORBIC ACID) 500 MG tablet Take 500 mg by mouth daily.     No current facility-administered medications for this visit.    Allergies  Allergen Reactions  . Codeine Hives  . Sulfa Antibiotics Hives  . Latex Itching and Rash    Health Maintenance Health Maintenance  Topic Date Due  . HIV Screening  04/06/1983  . TETANUS/TDAP  04/06/1987  . PAP SMEAR  04/05/1989  . INFLUENZA VACCINE  05/07/2017 (Originally 04/06/2016)     Exam:  BP 125/62   Pulse 70   Wt 177 lb (80.3 kg)   LMP 10/13/2016   BMI 31.35 kg/m  Gen: Well NAD HEENT: EOMI,  MMM Lungs: Normal work of breathing. CTABL Heart: RRR no MRG Abd: NABS, Soft. Nondistended, Nontender Exts: Brisk capillary refill, warm and well perfused.  Right calf well-appearing nontender and normal motion.   No results found for this or any previous visit (from  the past 72 hour(s)). No results found.    Assessment and Plan: 51 y.o. female with  Right calf injury: Doing well. Continue home exercises. Add eccentric exercises.  Recommend compression garment using body helix calf Sleeve.  Recheck PRN.   Health maintenance and hypothyroidism: We'll try to obtain records from [facility] as well as [employer].  Patient declined flu vaccine today.   No orders of the defined types were placed in this encounter.  No orders of the defined types were placed in this encounter.    Discussed warning signs or symptoms. Please see discharge instructions. Patient expresses understanding.

## 2016-11-01 NOTE — Patient Instructions (Signed)
Thank you for coming in today. Recheck as needed. Please get reccords from Dr Billy Coastaavon and Roni Breadobin Hood.   Work on [employer]alf Eccentric exercises we dicussed.  I recommend a calf sleeve. Sized medium.

## 2016-11-12 ENCOUNTER — Encounter: Payer: Self-pay | Admitting: Family Medicine

## 2018-01-25 LAB — LIPID PANEL
CHOLESTEROL: 159 (ref 0–200)
HDL: 18 — AB (ref 35–70)
LDL Cholesterol: 98
Triglycerides: 66 (ref 40–160)

## 2018-01-25 LAB — TSH: TSH: 0.75 (ref 0.41–5.90)

## 2018-01-25 LAB — HEMOGLOBIN A1C: HEMOGLOBIN A1C: 5.7 (ref 4.0–6.0)

## 2018-03-14 ENCOUNTER — Ambulatory Visit: Payer: BLUE CROSS/BLUE SHIELD | Admitting: Family Medicine

## 2018-04-19 ENCOUNTER — Ambulatory Visit: Payer: BLUE CROSS/BLUE SHIELD | Admitting: Family Medicine

## 2018-04-28 ENCOUNTER — Ambulatory Visit: Payer: BLUE CROSS/BLUE SHIELD | Admitting: Family Medicine

## 2018-04-28 ENCOUNTER — Encounter: Payer: Self-pay | Admitting: Family Medicine

## 2018-04-28 VITALS — BP 116/62 | HR 67 | Ht 63.0 in | Wt 161.0 lb

## 2018-04-28 DIAGNOSIS — Z1211 Encounter for screening for malignant neoplasm of colon: Secondary | ICD-10-CM

## 2018-04-28 DIAGNOSIS — E039 Hypothyroidism, unspecified: Secondary | ICD-10-CM | POA: Diagnosis not present

## 2018-04-28 DIAGNOSIS — Z0000 Encounter for general adult medical examination without abnormal findings: Secondary | ICD-10-CM

## 2018-04-28 DIAGNOSIS — E663 Overweight: Secondary | ICD-10-CM

## 2018-04-28 DIAGNOSIS — Z23 Encounter for immunization: Secondary | ICD-10-CM | POA: Diagnosis not present

## 2018-04-28 NOTE — Patient Instructions (Addendum)
Thank you for coming in today. Please send me lab results.  You should hear from Gastroenterology about colon cancer screening.  Recheck with me yearly or sooner if needed.  Continue healthy diet.  Try to get less than 60g of carbs per meal.

## 2018-04-28 NOTE — Progress Notes (Signed)
Kathleen Huffman is a 52 y.o. female who presents to [facility]: Primary Care Sports Medicine today for well adult visit.   Betzaira is doing well.  She takes medications listed below.  Her thyroid is managed by [facility].  She notes that she recently had labs at [facility] that showed stable cholesterol and normal thyroid hormones.  She notes that she will email lab results to me soon.  She notes that she is adopted a lower calorie lower carbohydrate diet.  She feels well with no acute issues.  She is managed to lose some weight with a lower carbohydrate diet.  Additionally she has been receiving regular care by her OB/GYN provider Dr. Billy Coast  She notes that she receives mammography yearly at her OB/GYN office.   She has not had colon cancer screening yet and is interested in colonoscopy. ROS as above:  Past Medical History:  Diagnosis Date  . Hypothyroidism   . SVD (spontaneous vaginal delivery)    x 1  . Thyroid disease    Past Surgical History:  Procedure Laterality Date  . DILATATION & CURETTAGE/HYSTEROSCOPY WITH MYOSURE N/A 12/10/2015   Procedure: DILATATION & CURETTAGE/HYSTEROSCOPY WITH MYOSURE;  Surgeon: Olivia Mackie, MD;  Location: [facility];  Service: Gynecology;  Laterality: N/A;  . TUBAL LIGATION    . WISDOM TOOTH EXTRACTION     Social History   Tobacco Use  . Smoking status: Former Smoker    Packs/day: 1.00    Years: 10.00    Pack years: 10.00    Types: Cigarettes    Last attempt to quit: 09/06/2000    Years since quitting: 17.6  . Smokeless tobacco: Never Used  Substance Use Topics  . Alcohol use: Yes    Alcohol/week: 0.0 standard drinks    Comment: occasionally wine   family history includes Alcohol abuse in her brother and father; Cancer in her maternal grandmother; Diabetes in her maternal uncle; Hyperlipidemia in her father and  maternal grandfather; Hypertension in her mother.  Medications: Current Outpatient Medications  Medication Sig Dispense Refill  . cetirizine (ZYRTEC) 10 MG tablet Take 5 mg by mouth daily.    . Cholecalciferol (VITAMIN D3) 5000 units CAPS Take 1 capsule by mouth daily.    Marland Kitchen co-enzyme Q-10 30 MG capsule Take 30 mg by mouth 3 (three) times daily.    . Cyanocobalamin (VITAMIN B-12 PO) Take 1 tablet by mouth 2 (two) times daily.     . Omega-3 Fatty Acids (OMEGA 3 PO) Take 1 capsule by mouth daily.    Marland Kitchen thyroid (NATURE-THROID) 65 MG tablet Take 32.5-65 mg by mouth 2 (two) times daily. 65 mg in the morning and 32.5 mg at night    . vitamin C (ASCORBIC ACID) 500 MG tablet Take 500 mg by mouth daily.    . progesterone (PROMETRIUM) 100 MG capsule TAKE 1 CAPSULE BY MOUTH AT BEDTIME STOP FOR MENSES  4   No current facility-administered medications for this visit.    Allergies  Allergen Reactions  . Codeine Hives  . Sulfa Antibiotics Hives  . Latex Itching and Rash    Health Maintenance Health Maintenance  Topic Date Due  . TETANUS/TDAP  04/06/1987  . COLONOSCOPY  04/05/2018  . INFLUENZA VACCINE  12/16/2018 (Originally 04/06/2018)  . HIV Screening  04/29/2019 (Originally 04/06/1983)  . MAMMOGRAM  08/31/2018  . PAP SMEAR  09/01/2019     Exam:  BP 116/62   Pulse  67   Ht 5\' 3"  (1.6 m)   Wt 161 lb (73 kg)   BMI 28.52 kg/m  Wt Readings from Last 5 Encounters:  04/28/18 161 lb (73 kg)  11/01/16 177 lb (80.3 kg)  10/17/16 180 lb (81.6 kg)  11/27/15 175 lb (79.4 kg)  06/09/15 177 lb (80.3 kg)     Gen: Well NAD HEENT: EOMI,  MMM Lungs: Normal work of breathing. CTABL Heart: RRR no MRG Abd: NABS, Soft. Nondistended, Nontender Exts: Brisk capillary refill, warm and well perfused.  Psych: alert and oriented.  Normal speech thought process and affect.  Depression screen [place] 2/9 04/28/2018 04/28/2018  Decreased Interest 0 0  Down, Depressed, Hopeless 0 0  PHQ - 2 Score 0 0        Lab and Radiology Results Labs will be obtained from [facility] and abstracted. Mammography will be obtained from [facility] and abstracted.   Assessment and Plan: 52 y.o. female with  Well adult visit.  Doing quite well.  Patient is losing weight by eating a healthier diet.  BMI is 28 weight improved.  Plan to continue current regimen.  Will obtain medical records and labs and mammography as above.  She is due for colon cancer screening.  [facility] for colon cancer screening ordered today.   Patient declined influenza vaccine. Tdap vaccine given today prior to discharge.  Orders Placed This Encounter  Procedures  . Tdap vaccine greater than or equal to 7yo IM  . Ambulatory referral to Gastroenterology    Referral Priority:   Routine    Referral Type:   Consultation    Referral Reason:   Specialty Services Required    Number of Visits Requested:   1   No orders of the defined types were placed in this encounter.    Discussed warning signs or symptoms. Please see discharge instructions. Patient expresses understanding.

## 2018-05-16 ENCOUNTER — Telehealth: Payer: Self-pay | Admitting: Family Medicine

## 2018-05-16 NOTE — Telephone Encounter (Signed)
Received notes from [facility].  Lipid panel received from May 2019.  LDL 98 A1c 5.7 TSH 0.75 Estradiol less than 5  Notes and labs will be scanned and abstracted.

## 2018-05-31 ENCOUNTER — Encounter: Payer: Self-pay | Admitting: Gastroenterology

## 2018-05-31 ENCOUNTER — Encounter: Payer: Self-pay | Admitting: Family Medicine

## 2018-06-16 ENCOUNTER — Encounter: Payer: Self-pay | Admitting: Family Medicine

## 2018-06-16 LAB — CHG ASSAY OF BLOOD LIPOPROTEIN,VLDL CHOLEST
Estradiol: <5
T3 FREE: 5.1
Thyroxine (T4): 1.14
VLDL CHOLESTEROL, CALC: 13

## 2018-06-22 LAB — FOLLICLE STIMULATING HORMONE: FSH: 17.5

## 2018-06-22 LAB — ESTRADIOL: ESTRADIOL: 147

## 2018-06-22 LAB — THYROXINE (T4) FREE, DIRECT, S: T4,FREE (DIRECT): 0.9

## 2018-06-22 LAB — TESTOSTERONE, FREE: TESTOSTERONE: 46.6

## 2018-06-22 LAB — T3, FREE: T3 FREE: 3.6

## 2018-06-26 LAB — LIPID PANEL
CHOLESTEROL: 206 — AB (ref 0–200)
HDL: 54 (ref 35–70)
LDL Cholesterol: 129
TRIGLYCERIDES: 114 (ref 40–160)

## 2018-06-26 LAB — TSH: TSH: 0.87 (ref 0.41–5.90)

## 2018-06-26 LAB — HEMOGLOBIN A1C: Hgb A1c MFr Bld: 5.5 (ref 4.0–6.0)

## 2018-07-10 ENCOUNTER — Ambulatory Visit (AMBULATORY_SURGERY_CENTER): Payer: Self-pay | Admitting: *Deleted

## 2018-07-10 VITALS — Ht 63.0 in | Wt 170.0 lb

## 2018-07-10 DIAGNOSIS — Z1211 Encounter for screening for malignant neoplasm of colon: Secondary | ICD-10-CM

## 2018-07-10 NOTE — Progress Notes (Signed)
Patient denies any allergies to eggs or soy. Patient denies any problems with anesthesia/sedation. Patient denies any oxygen use at home. Patient denies taking any diet/weight loss medications or blood thinners. EMMI education offered, pt declined.  

## 2018-07-11 ENCOUNTER — Encounter: Payer: Self-pay | Admitting: Gastroenterology

## 2018-07-24 ENCOUNTER — Encounter: Payer: Self-pay | Admitting: Gastroenterology

## 2018-07-24 ENCOUNTER — Ambulatory Visit (AMBULATORY_SURGERY_CENTER): Payer: BLUE CROSS/BLUE SHIELD | Admitting: Gastroenterology

## 2018-07-24 VITALS — BP 122/76 | HR 77 | Temp 98.9°F | Resp 15 | Ht 63.0 in | Wt 170.0 lb

## 2018-07-24 DIAGNOSIS — Z1211 Encounter for screening for malignant neoplasm of colon: Secondary | ICD-10-CM

## 2018-07-24 MED ORDER — SODIUM CHLORIDE 0.9 % IV SOLN: 500.0000 mL | Freq: Once | INTRAVENOUS | Status: DC

## 2018-07-24 NOTE — Patient Instructions (Signed)
Repeat colonoscopy in 10 years.  YOU HAD AN ENDOSCOPIC PROCEDURE TODAY AT THE [facility]:   Refer to the procedure report that was given to you for any specific questions about what was found during the examination.  If the procedure report does not answer your questions, please call your gastroenterologist to clarify.  If you requested that your care partner not be given the details of your procedure findings, then the procedure report has been included in a sealed envelope for you to review at your convenience later.  YOU SHOULD EXPECT: Some feelings of bloating in the abdomen. Passage of more gas than usual.  Walking can help get rid of the air that was put into your GI tract during the procedure and reduce the bloating. If you had a lower endoscopy (such as a colonoscopy or flexible sigmoidoscopy) you may notice spotting of blood in your stool or on the toilet paper. If you underwent a bowel prep for your procedure, you may not have a normal bowel movement for a few days.  Please Note:  You might notice some irritation and congestion in your nose or some drainage.  This is from the oxygen used during your procedure.  There is no need for concern and it should clear up in a day or so.  SYMPTOMS TO REPORT IMMEDIATELY:   Following lower endoscopy (colonoscopy or flexible sigmoidoscopy):  Excessive amounts of blood in the stool  Significant tenderness or worsening of abdominal pains  Swelling of the abdomen that is new, acute  Fever of 100F or higher   For urgent or emergent issues, a gastroenterologist can be reached at any hour by calling [phone].   DIET:  We do recommend a small meal at first, but then you may proceed to your regular diet.  Drink plenty of fluids but you should avoid alcoholic beverages for 24 hours.  ACTIVITY:  You should plan to take it easy for the rest of today and you should NOT DRIVE or use heavy machinery until tomorrow (because of the sedation  medicines used during the test).    FOLLOW UP: Our staff will call the number listed on your records the next business day following your procedure to check on you and address any questions or concerns that you may have regarding the information given to you following your procedure. If we do not reach you, we will leave a message.  However, if you are feeling well and you are not experiencing any problems, there is no need to return our call.  We will assume that you have returned to your regular daily activities without incident.  If any biopsies were taken you will be contacted by phone or by letter within the next 1-3 weeks.  Please call us at [phone] if you have not heard about the biopsies in 3 weeks.    SIGNATURES/CONFIDENTIALITY: You and/or your care partner have signed paperwork which will be entered into your electronic medical record.  These signatures attest to the fact that that the information above on your After Visit Summary has been reviewed and is understood.  Full responsibility of the confidentiality of this discharge information lies with you and/or your care-partner. 

## 2018-07-24 NOTE — Op Note (Signed)
[facility] Patient Name: Kathleen Huffman Procedure Date: 07/24/2018 8:30 AM MRN: [id number] Endoscopist: Tressia Danas MD, MD Age: 52 Referring MD:  Date of Birth: 1965/10/16 Gender: Female Account #: [id number] Procedure:                Colonoscopy Indications:              Screening for colorectal malignant neoplasm, This                            is the patient's first colonoscopy. No baseline GI                            symptoms. Medicines:                See the Anesthesia note for documentation of the                            administered medications Procedure:                Pre-Anesthesia Assessment:                           - Prior to the procedure, a History and Physical                            was performed, and patient medications and                            allergies were reviewed. The patient's tolerance of                            previous anesthesia was also reviewed. The risks                            and benefits of the procedure and the sedation                            options and risks were discussed with the patient.                            All questions were answered, and informed consent                            was obtained. Prior Anticoagulants: The patient has                            taken no previous anticoagulant or antiplatelet                            agents. ASA Grade Assessment: II - A patient with                            mild systemic disease. After reviewing the risks  and benefits, the patient was deemed in                            satisfactory condition to undergo the procedure.                           After obtaining informed consent, the colonoscope                            was passed under direct vision. Throughout the                            procedure, the patient's blood pressure, pulse, and                            oxygen saturations were monitored continuously.  The                            Colonoscope was introduced through the anus and                            advanced to the the terminal ileum, with                            identification of the appendiceal orifice and IC                            valve. The colonoscopy was performed without                            difficulty. The patient tolerated the procedure                            well. The quality of the bowel preparation was good. Scope In: 8:37:49 AM Scope Out: 8:52:06 AM Scope Withdrawal Time: 0 hours 8 minutes 22 seconds  Total Procedure Duration: 0 hours 14 minutes 17 seconds  Findings:                 The perianal and digital rectal examinations were                            normal.                           The entire examined colon appeared normal on direct                            and retroflexion views. There was mild barotrauma                            in the cecum. Complications:            No immediate complications. Estimated Blood Loss:     Estimated blood loss: none. Impression:               - The entire  examined colon is normal on direct and                            retroflexion views.                           - No specimens collected. Recommendation:           - Discharge patient to home.                           - Resume regular diet today.                           - Continue present medications.                           - Repeat colonoscopy in 10 years for screening                            purposes. Tressia DanasKimberly Saniyah Mondesir MD, MD 07/24/2018 8:56:44 AM This report has been signed electronically.

## 2018-07-24 NOTE — Progress Notes (Signed)
Ambulated around unit to try and relieve bloating.

## 2018-07-24 NOTE — Progress Notes (Signed)
Pt's states no medical or surgical changes since previsit or office visit. 

## 2018-07-24 NOTE — Progress Notes (Signed)
To PACU, VSS. Report to [email or web address] 

## 2018-07-25 ENCOUNTER — Telehealth: Payer: Self-pay | Admitting: *Deleted

## 2018-07-25 ENCOUNTER — Telehealth: Payer: Self-pay | Admitting: Family Medicine

## 2018-07-25 ENCOUNTER — Encounter: Payer: Self-pay | Admitting: Family Medicine

## 2018-07-25 NOTE — Telephone Encounter (Signed)
  Follow up Call-  Call back number 07/24/2018  Post procedure Call Back phone  # [phone]  Permission to leave phone message Yes  Some recent data might be hidden     Patient questions:  Do you have a fever, pain , or abdominal swelling? No. Pain Score  0 *  Have you tolerated food without any problems? Yes.    Have you been able to return to your normal activities? Yes.    Do you have any questions about your discharge instructions: Diet   No. Medications  No. Follow up visit  No.  Do you have questions or concerns about your Care? No.  Actions: * If pain score is 4 or above: No action needed, pain <4.

## 2018-07-25 NOTE — Telephone Encounter (Signed)
Received documentation from [facility].  Labs obtained show LDL 129. A1c 5.5. TSH 0.873 and free 0.98.  Plan for low-carb diet.  Plan for coconut oil and low-carb diet and omega-3. Plan for continued nature thyroid. Continue B12 and vitamin D supplementation. Holding DHEA/pregnenolone. Using progesterone at bedtime for night sweats.

## 2018-07-27 ENCOUNTER — Ambulatory Visit: Payer: BLUE CROSS/BLUE SHIELD | Admitting: Family Medicine

## 2018-07-27 ENCOUNTER — Encounter: Payer: Self-pay | Admitting: Family Medicine

## 2018-07-27 VITALS — BP 122/71 | HR 69 | Temp 98.3°F | Wt 169.5 lb

## 2018-07-27 DIAGNOSIS — K589 Irritable bowel syndrome without diarrhea: Secondary | ICD-10-CM | POA: Insufficient documentation

## 2018-07-27 DIAGNOSIS — R1030 Lower abdominal pain, unspecified: Secondary | ICD-10-CM | POA: Diagnosis not present

## 2018-07-27 LAB — POCT URINALYSIS DIPSTICK
BILIRUBIN UA: NEGATIVE
GLUCOSE UA: NEGATIVE
KETONES UA: NEGATIVE
Leukocytes, UA: NEGATIVE
Nitrite, UA: NEGATIVE
PH UA: 6.5 (ref 5.0–8.0)
Protein, UA: NEGATIVE
Spec Grav, UA: 1.02 (ref 1.010–1.025)
UROBILINOGEN UA: 0.2 U/dL

## 2018-07-27 MED ORDER — DICYCLOMINE HCL 10 MG PO CAPS: 10.0000 mg | ORAL_CAPSULE | Freq: Three times a day (TID) | ORAL | 1 refills | Status: DC

## 2018-07-27 NOTE — Patient Instructions (Addendum)
Thank you for coming in today. Try dicyclomine up to 4x daily for gut spasm.  Let me know if not better.  If your belly pain worsens, or you have high fever, bad vomiting, blood in your stool or black tarry stool go to the Emergency Room.   Let me know if not better.

## 2018-07-27 NOTE — Progress Notes (Signed)
Kathleen Huffman is a 52 y.o. female, with a history of colon spasms and IBS, who [facility] today for abdominal pain. A few days prior to Monday, [name] started having lower abdominal pain after being jostled a little more than normal by her horse. She then had a colonoscopy on Monday and reports having difficulty passing gas afterwards. She has continued to have hypogastric abdominal pain, which radiates to her right lower quadrant. She also notes a shooting pain "straight down" into her anus. The pain is sharp and takes her breath away. These pains are smiilar to her prior colon spasms and IBS. However, she hasn't had any spasms in a while, so this surprised her.   She had sharp, hypogastric pain this morning preceding a bowel movement, and the pain was relieved after BM. She also complains of occasional spasms when urinating. She does deep breathing to help relieve the pain, but nothing triggers the pain or makes it worse. She has not tried any medication. She denies fevers, chills, chest pain, dyspnea, N/V, diarrhea, constipation, hard stools, and blood in her bowel movements.   Of note, she is going through menopause, and her menstrual cycle started yesterday. She has a history of ectopic pregnancy, d&c, and tubal ligation.  ROS as above:  Exam:  BP 122/71   Pulse 69   Temp 98.3 F (36.8 C) (Oral)   Wt 169 lb 8 oz (76.9 kg)   LMP 07/10/2018   BMI 30.03 kg/m  Wt Readings from Last 5 Encounters:  07/27/18 169 lb 8 oz (76.9 kg)  07/24/18 170 lb (77.1 kg)  07/10/18 170 lb (77.1 kg)  04/28/18 161 lb (73 kg)  11/01/16 177 lb (80.3 kg)    Gen: Well NAD HEENT: EOMI,  MMM Lungs: Normal work of breathing. CTABL Heart: RRR no MRG Abd: NABS, Soft. Nondistended. Mildly TTP in the RLQ and hypogastrium. Negative McBurney's sign. Negative psoas test.  No  rebound or guarding. Exts: Brisk capillary refill, warm and well perfused.   Lab and Radiology Results Urinalysis 11/21: Moderate Blood Negative Nitrites Negative Leukocyte esterase   Assessment and Plan: 52 y.o. female with  Hypogastric abdominal pain: [name] has had hypogastric abdominal pain radiating to the right lower quadrant for about a week. The pain has occasionally been relieved after bowel movement. She has not had blood in BMs. Concern for colon spasm, appendicitis, kidney stones, and complication from colonoscopy. This is unlikely to be appendicitis due to the prolonged course of her symptoms, negative ROS, negative McBurney's sign, and negative psoas sign. She had moderate blood on urinalysis, which along with her abdominal pain, was concerning for kidney stones. However, she just started her menstrual cycle yesterday, which explains the blood. Complication from colonoscopy is unlikely but possible. This is most likely colon spasm. Her presentation is similar to her spasms in the past. Prescribed dicyclomine up to four times daily for spasm. Advised pt to call back if she worsens or has blood in her stool. Will consider CT if needed.     Meds ordered this encounter  Medications  . dicyclomine (BENTYL) 10 MG capsule    Sig: Take 1 capsule (10 mg total) by mouth 4 (four) times daily -  before meals and at bedtime.    Dispense:  30 capsule    Refill:  1     Historical information moved to improve visibility of documentation.  Past  Medical History:  Diagnosis Date  . Hypothyroidism   . SVD (spontaneous vaginal delivery)    x 1  . Thyroid disease    Past Surgical History:  Procedure Laterality Date  . DILATATION & CURETTAGE/HYSTEROSCOPY WITH MYOSURE N/A 12/10/2015   Procedure: DILATATION & CURETTAGE/HYSTEROSCOPY WITH MYOSURE;  Surgeon: Olivia Mackie, MD;  Location: [facility];  Service: Gynecology;  Laterality: N/A;  . TUBAL LIGATION    . WISDOM TOOTH EXTRACTION      Social History   Tobacco Use  . Smoking status: Former Smoker    Packs/day: 1.00    Years: 10.00    Pack years: 10.00    Types: Cigarettes    Last attempt to quit: 09/06/2000    Years since quitting: 17.8  . Smokeless tobacco: Never Used  Substance Use Topics  . Alcohol use: Not Currently    Alcohol/week: 0.0 standard drinks    Comment: occasionally wine   family history includes Alcohol abuse in her brother and father; Cancer in her maternal grandmother; Colon polyps in her paternal grandmother; Diabetes in her maternal uncle; Hyperlipidemia in her father and maternal grandfather; Hypertension in her mother.  Medications: Current Outpatient Medications  Medication Sig Dispense Refill  . Bioflavonoid Products (GRAPE SEED PO) Take by mouth.    . cetirizine (ZYRTEC) 10 MG tablet Take 5 mg by mouth daily.    . Cholecalciferol (VITAMIN D3) 5000 units CAPS Take 1 capsule by mouth daily.    Marland Kitchen co-enzyme Q-10 30 MG capsule Take 30 mg by mouth 3 (three) times daily.    . Cyanocobalamin (VITAMIN B-12 PO) Take 1 tablet by mouth 2 (two) times daily.     . Menaquinone-7 (VITAMIN K2 PO) Take by mouth.    . Omega-3 Fatty Acids (OMEGA 3 PO) Take 1 capsule by mouth daily.    . progesterone (PROMETRIUM) 100 MG capsule TAKE 1 CAPSULE BY MOUTH AT BEDTIME STOP FOR MENSES  4  . thyroid (NATURE-THROID) 65 MG tablet Take 32.5-65 mg by mouth 2 (two) times daily. 65 mg in the morning and 32.5 mg at night    . vitamin C (ASCORBIC ACID) 500 MG tablet Take 500 mg by mouth daily.    Marland Kitchen dicyclomine (BENTYL) 10 MG capsule Take 1 capsule (10 mg total) by mouth 4 (four) times daily -  before meals and at bedtime. 30 capsule 1   No current facility-administered medications for this visit.    Allergies  Allergen Reactions  . Codeine Hives  . Flexeril [Cyclobenzaprine] Other (See Comments)    "knocks me out"  . Sulfa Antibiotics Hives  . Latex Itching and Rash     Discussed warning signs or symptoms. Please  see discharge instructions. Patient expresses understanding.  I personally was present and performed or re-performed the history, physical exam and medical decision-making activities of this service and have verified that the service and findings are accurately documented in the student's note. ___________________________________________ Clementeen Graham M.D., ABFM., CAQSM. Primary Care and Sports Medicine Adjunct Instructor of Family Medicine  [employer]

## 2019-01-03 ENCOUNTER — Telehealth: Payer: Self-pay | Admitting: Family Medicine

## 2019-01-03 DIAGNOSIS — Z1239 Encounter for other screening for malignant neoplasm of breast: Secondary | ICD-10-CM

## 2019-01-03 NOTE — Telephone Encounter (Signed)
I am reviewing my patient population I noticed that you are due for mammogram.  I went ahead and ordered a mammogram.  We will call and schedule you in the near future once things are a bit more calm with COVID-19.  Additionally I am calling to check in to see how you are doing.  If he had any issues I am able to have visits using video conferencing software to reduce your risk of exposure to COVID-19.  If you do anything please let me know.

## 2019-01-04 NOTE — Telephone Encounter (Signed)
Called pt, states she already had mammogram done with OB-GYN (Dr Billy Coast). She is having them fax it to our office. No further needs at this time.

## 2019-05-01 ENCOUNTER — Encounter: Payer: BLUE CROSS/BLUE SHIELD | Admitting: Family Medicine

## 2020-04-17 ENCOUNTER — Encounter: Payer: Self-pay | Admitting: Medical-Surgical

## 2020-04-17 ENCOUNTER — Other Ambulatory Visit: Payer: Self-pay

## 2020-04-17 ENCOUNTER — Ambulatory Visit (INDEPENDENT_AMBULATORY_CARE_PROVIDER_SITE_OTHER): Payer: BC Managed Care – PPO | Admitting: Medical-Surgical

## 2020-04-17 VITALS — BP 115/78 | HR 67 | Temp 97.8°F | Ht 63.0 in | Wt 173.8 lb

## 2020-04-17 DIAGNOSIS — M722 Plantar fascial fibromatosis: Secondary | ICD-10-CM

## 2020-04-17 DIAGNOSIS — Z7689 Persons encountering health services in other specified circumstances: Secondary | ICD-10-CM

## 2020-04-17 DIAGNOSIS — R300 Dysuria: Secondary | ICD-10-CM

## 2020-04-17 DIAGNOSIS — Z6830 Body mass index (BMI) 30.0-30.9, adult: Secondary | ICD-10-CM

## 2020-04-17 LAB — POCT URINALYSIS DIP (CLINITEK)
Bilirubin, UA: NEGATIVE
Blood, UA: NEGATIVE
Glucose, UA: NEGATIVE mg/dL
Ketones, POC UA: NEGATIVE mg/dL
Leukocytes, UA: NEGATIVE
Nitrite, UA: NEGATIVE
POC PROTEIN,UA: NEGATIVE
Spec Grav, UA: >=1.03 — AB (ref 1.010–1.025)
Urobilinogen, UA: 0.2 E.U./dL
pH, UA: 6 (ref 5.0–8.0)

## 2020-04-17 MED ORDER — MELOXICAM 15 MG PO TABS
ORAL_TABLET | ORAL | 0 refills | Status: DC
Start: 2020-04-17 — End: 2021-07-20

## 2020-04-17 MED ORDER — PHENTERMINE HCL 37.5 MG PO TABS: ORAL_TABLET | ORAL | 0 refills | Status: DC

## 2020-04-17 NOTE — Progress Notes (Signed)
Subjective:    CC: Urine check, weight loss, right foot pain  HPI: Pleasant 54 year old female presenting today to discuss the following:  Urine check-recently experienced some urinary tract symptoms and was prescribed a round of antibiotics.  The prescriber advised her to have her urine checked for clearance after she completed her antibiotics.  She no longer has any urinary symptoms but notes that her stomach has been cramping since she started taking the antibiotics.  Weight loss-has been struggling with weight loss for quite a while.  Works long hours and is outside quite a bit.  This disturbs her eating habits and makes healthy dieting and regular exercise difficult.  She would like to try medication in conjunction with dietary changes.  Goal weight is 145 pounds.  Right foot pain-has recently been having difficulty with pain in the arch of her right foot and along the lateral right foot with the first few steps in the morning.  Notes that when resting she does not have that pain.  She does work on her feet quite a bit and spends long hours standing.  Has tried using a frozen water bottle as well as a tennis ball but that has not provided any resolution.  She is also tried wearing supportive shoes with arch support but that is more uncomfortable than she can tolerate throughout the day.  The most comfortable shoes she has are crocs even though they are not very supportive.  I reviewed the past medical history, family history, social history, surgical history, and allergies today and no changes were needed.  Please see the problem list section below in epic for further details.  Past Medical History: Past Medical History:  Diagnosis Date  . Hypothyroidism   . SVD (spontaneous vaginal delivery)    x 1  . Thyroid disease    Past Surgical History: Past Surgical History:  Procedure Laterality Date  . DILATATION & CURETTAGE/HYSTEROSCOPY WITH MYOSURE N/A 12/10/2015   Procedure: DILATATION &  CURETTAGE/HYSTEROSCOPY WITH MYOSURE;  Surgeon: Olivia Mackie, MD;  Location: [facility];  Service: Gynecology;  Laterality: N/A;  . TUBAL LIGATION    . WISDOM TOOTH EXTRACTION     Social History: Social History   Socioeconomic History  . Marital status: Married    Spouse name: Not on file  . Number of children: Not on file  . Years of education: Not on file  . Highest education level: Not on file  Occupational History  . Not on file  Tobacco Use  . Smoking status: Former Smoker    Packs/day: 1.00    Years: 10.00    Pack years: 10.00    Types: Cigarettes    Quit date: 09/06/2000    Years since quitting: 19.6  . Smokeless tobacco: Never Used  Vaping Use  . Vaping Use: Never used  Substance and Sexual Activity  . Alcohol use: Not Currently    Alcohol/week: 0.0 standard drinks  . Drug use: No  . Sexual activity: Yes    Partners: Male    Birth control/protection: Post-menopausal  Other Topics Concern  . Not on file  Social History Narrative  . Not on file   Social Determinants of Health   Financial Resource Strain:   . Difficulty of Paying Living Expenses:   Food Insecurity:   . Worried About Programme researcher, broadcasting/film/video in the Last Year:   . Barista in the Last Year:   Transportation Needs:   . Freight forwarder (Medical):   Marland Kitchen  Lack of Transportation (Non-Medical):   Physical Activity:   . Days of Exercise per Week:   . Minutes of Exercise per Session:   Stress:   . Feeling of Stress :   Social Connections:   . Frequency of Communication with Friends and Family:   . Frequency of Social Gatherings with Friends and Family:   . Attends Religious Services:   . Active Member of Clubs or Organizations:   . Attends Banker Meetings:   Marland Kitchen Marital Status:    Family History: Family History  Problem Relation Age of Onset  . Hypertension Mother   . Alcohol abuse Father   . Hyperlipidemia Father   . Alcohol abuse Brother   . Diabetes Maternal Uncle   .  Cancer Maternal Grandmother   . Hyperlipidemia Maternal Grandfather   . Colon polyps Paternal Grandmother   . Colon cancer Neg Hx   . Esophageal cancer Neg Hx   . Rectal cancer Neg Hx   . Stomach cancer Neg Hx    Allergies: Allergies  Allergen Reactions  . Codeine Hives  . Flexeril [Cyclobenzaprine] Other (See Comments)    "knocks me out"  . Sulfa Antibiotics Hives  . Latex Itching and Rash   Medications: See med rec.  Review of Systems: See HPI for pertinent positives and negatives.   Objective:    General: Well Developed, well nourished, and in no acute distress.  Neuro: Alert and oriented x3.  HEENT: Normocephalic, atraumatic.  Skin: Warm and dry. Cardiac: Regular rate and rhythm, no murmurs rubs or gallops, no lower extremity edema.  Respiratory: Clear to auscultation bilaterally. Not using accessory muscles, speaking in full sentences. Right foot: Able to bear weight without difficulty at time of appointment.  Some tenderness to the middle of the right arch.  Full range of motion.   Impression and Recommendations:    1. Encounter to establish care Reviewed available records and discussed health concerns.  Patient sees [facility] regularly.  She is scheduled for a mammogram as well as a Pap smear next month.  She also will have a new blood work panel drawn with [facility] in September.  Patient reports she will have that sent to [place] for our records.  2. Dysuria POCT urinalysis looks good, specific gravity greater than 1.030 so she is likely a bit dehydrated.  Advised increasing water intake, especially when outside in the heat. - POCT URINALYSIS DIP (CLINITEK)  3. BMI 30.0-30.9,adult Discussed weight loss options.  Patient would like to try phentermine.  We are starting phentermine 37.5 mg tabs, take half tab daily.  Discussed weight loss expectations, dietary modifications, and potential side effects to monitor for.  Patient verbalized  understanding and is agreeable to the plan.  4. Plantar fasciitis, right Discussed use of tennis ball and frozen water bottle.  Demonstrated appropriate stretches for plantar fasciitis.  We will also send in meloxicam 7.5-15 mg daily for 14 days then daily as needed.  Advised patient to avoid other ibuprofen type products while she is on this medication.  Recommend supportive shoes with arch support, especially if she is going to be on her feet for long hours.  Return in about 4 weeks (around 05/15/2020) for weight check. ___________________________________________ Thayer Ohm, DNP, APRN, FNP-BC Primary Care and Sports Medicine [facility]

## 2020-05-15 ENCOUNTER — Encounter: Payer: Self-pay | Admitting: Medical-Surgical

## 2020-05-15 ENCOUNTER — Ambulatory Visit (INDEPENDENT_AMBULATORY_CARE_PROVIDER_SITE_OTHER): Payer: BC Managed Care – PPO | Admitting: Medical-Surgical

## 2020-05-15 VITALS — BP 103/68 | HR 71 | Temp 98.0°F | Ht 63.0 in | Wt 177.1 lb

## 2020-05-15 DIAGNOSIS — Z6831 Body mass index (BMI) 31.0-31.9, adult: Secondary | ICD-10-CM

## 2020-05-15 NOTE — Progress Notes (Signed)
Subjective:    CC: weight check  HPI: Pleasant 54 year old female presenting today for weight check on phentermine.  She was prescribed to take one half of a tablet per day but was unable to tolerate the half tablet dose due to tachycardia, jitteriness, and the feeling of her skin crawling.  She also experiences significant difficulty with sleeping at the half tablet dose.  She decided to try taking one quarter of a tablet which is far more tolerable without all of the side effects.  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  Notes that she was worried about only been able to do one quarter of a tablet of phentermine daily so she is also doing [place].  She notes that she eats small meals 4-5 times daily and then one bigger meal.  Small meals usually consist of some sort of protein with a bit of fruit or a protein shake.  She is allowed 100 cal of popcorn which she usually eats at suppertime.  She feels like her clothes are fitting better overall.  She has not weighed herself since our last appointment.  Endorses elevated stress level especially in the last several months.  She also has difficulty sleeping some nights where she is able to fall asleep okay but wakes after a couple of hours and is unable to fall back to sleep. Has friends that have told her about CBD Gummies and how they help them with weight loss and sleep at night. She is considering trying this. Denies chest pain, shortness of breath, dizziness, lower extremity edema, fever, and chills.  I reviewed the past medical history, family history, social history, surgical history, and allergies today and no changes were needed.  Please see the problem list section below in epic for further details.  Past Medical History: Past Medical History:  Diagnosis Date  . Hypothyroidism   . SVD (spontaneous vaginal delivery)    x 1  . Thyroid disease    Past Surgical History: Past Surgical History:   Procedure Laterality Date  . DILATATION & CURETTAGE/HYSTEROSCOPY WITH MYOSURE N/A 12/10/2015   Procedure: DILATATION & CURETTAGE/HYSTEROSCOPY WITH MYOSURE;  Surgeon: Olivia Mackie, MD;  Location: [facility];  Service: Gynecology;  Laterality: N/A;  . TUBAL LIGATION    . WISDOM TOOTH EXTRACTION     Social History: Social History   Socioeconomic History  . Marital status: Married    Spouse name: Not on file  . Number of children: Not on file  . Years of education: Not on file  . Highest education level: Not on file  Occupational History  . Not on file  Tobacco Use  . Smoking status: Former Smoker    Packs/day: 1.00    Years: 10.00    Pack years: 10.00    Types: Cigarettes    Quit date: 09/06/2000    Years since quitting: 19.7  . Smokeless tobacco: Never Used  Vaping Use  . Vaping Use: Never used  Substance and Sexual Activity  . Alcohol use: Not Currently    Alcohol/week: 0.0 standard drinks  . Drug use: No  . Sexual activity: Yes    Partners: Male    Birth control/protection: Post-menopausal  Other Topics Concern  . Not on file  Social History Narrative  . Not on file   Social Determinants of Health   Financial Resource Strain:   . Difficulty of Paying Living Expenses: Not on file  Food Insecurity:   .  Worried About Programme researcher, broadcasting/film/video in the Last Year: Not on file  . Ran Out of Food in the Last Year: Not on file  Transportation Needs:   . Lack of Transportation (Medical): Not on file  . Lack of Transportation (Non-Medical): Not on file  Physical Activity:   . Days of Exercise per Week: Not on file  . Minutes of Exercise per Session: Not on file  Stress:   . Feeling of Stress : Not on file  Social Connections:   . Frequency of Communication with Friends and Family: Not on file  . Frequency of Social Gatherings with Friends and Family: Not on file  . Attends Religious Services: Not on file  . Active Member of Clubs or Organizations: Not on file  . Attends Tax inspector Meetings: Not on file  . Marital Status: Not on file   Family History: Family History  Problem Relation Age of Onset  . Hypertension Mother   . Alcohol abuse Father   . Hyperlipidemia Father   . Alcohol abuse Brother   . Diabetes Maternal Uncle   . Cancer Maternal Grandmother   . Hyperlipidemia Maternal Grandfather   . Colon polyps Paternal Grandmother   . Colon cancer Neg Hx   . Esophageal cancer Neg Hx   . Rectal cancer Neg Hx   . Stomach cancer Neg Hx    Allergies: Allergies  Allergen Reactions  . Codeine Hives  . Flexeril [Cyclobenzaprine] Other (See Comments)    "knocks me out"  . Sulfa Antibiotics Hives  . Latex Itching and Rash   Medications: See med rec.  Review of Systems: See HPI for pertinent positives and negatives.   Objective:    General: Well Developed, well nourished, and in no acute distress.  Neuro: Alert and oriented x3.  HEENT: Normocephalic, atraumatic.  Skin: Warm and dry. Cardiac: Regular rate and rhythm, no murmurs rubs or gallops, no lower extremity edema.  Respiratory: Clear to auscultation bilaterally. Not using accessory muscles, speaking in full sentences.   Impression and Recommendations:    1. BMI 31.0-31.9,adult Per our scales, it looks as if she has gained approximately 3.5 pounds over the last 4 weeks. Given that her clothes are fitting better and she has been eating a higher protein diet, suspect that she has increased muscle mass which could explain the weight gain. Discussed tolerance of phentermine and other options for weight loss. She would like to continue with another 4 weeks of 1/4 tablet phentermine daily in conjunction with her dietary changes. Suggest she go home and do body measurements of her thighs, upper arms, and waist. Prior to her next weight check appointment in 4 weeks, advised to recheck measurements for changes. Discussed the role of cortisol in relation to weight. May benefit from adding melatonin at  night to help with sleep. Advised patient to let me know if she would like to discuss possible medications to manage her stress level.  Return in about 4 weeks (around 06/12/2020) for weight check. ___________________________________________ Thayer Ohm, DNP, APRN, FNP-BC Primary Care and Sports Medicine [facility]

## 2020-06-11 NOTE — Progress Notes (Signed)
   Complete physical exam  Patient: Kathleen Huffman   DOB: 06/26/1997   54 y.o. Female  MRN: [id number]  Subjective:    No chief complaint on file.   Kathleen Huffman is a 54 y.o. female who presents today for a complete physical exam. She reports consuming a {diet types:17450} diet. {types:19826} She generally feels {DESC; WELL/FAIRLY WELL/POORLY:18703}. She reports sleeping {DESC; WELL/FAIRLY WELL/POORLY:18703}. She {does/does not:200015} have additional problems to discuss today.    Most recent fall risk assessment:    03/03/2022   10:42 AM  Fall Risk   Falls in the past year? 0  Number falls in past yr: 0  Injury with Fall? 0  Risk for fall due to : No Fall Risks  Follow up Falls evaluation completed     Most recent depression screenings:    03/03/2022   10:42 AM 01/22/2021   10:46 AM  PHQ 2/9 Scores  PHQ - 2 Score 0 0  PHQ- 9 Score 5     {VISON DENTAL STD PSA (Optional):27386}  {History (Optional):23778}  Patient Care Team: Abeer Iversen, NP as PCP - General (Nurse Practitioner)   Outpatient Medications Prior to Visit  Medication Sig   fluticasone (FLONASE) 50 MCG/ACT nasal spray Place 2 sprays into both nostrils in the morning and at bedtime. After 7 days, reduce to once daily.   norgestimate-ethinyl estradiol (SPRINTEC 28) 0.25-35 MG-MCG tablet Take 1 tablet by mouth daily.   Nystatin POWD Apply liberally to affected area 2 times per day   spironolactone (ALDACTONE) 100 MG tablet Take 1 tablet (100 mg total) by mouth daily.   No facility-administered medications prior to visit.    ROS        Objective:     There were no vitals taken for this visit. {Vitals History (Optional):23777}  Physical Exam   No results found for any visits on 04/08/22. {Show previous labs (optional):23779}    Assessment & Plan:    Routine Health Maintenance and Physical Exam  Immunization History  Administered Date(s) Administered   DTaP 09/09/1999, 11/05/1999,  01/14/2000, 09/29/2000, 04/14/2004   Hepatitis A 02/09/2008, 02/14/2009   Hepatitis B 06/27/1999, 08/04/1999, 01/14/2000   HiB (PRP-OMP) 09/09/1999, 11/05/1999, 01/14/2000, 09/29/2000   IPV 09/09/1999, 11/05/1999, 07/04/2000, 04/14/2004   Influenza,inj,Quad PF,6+ Mos 05/17/2014   Influenza-Unspecified 08/16/2012   MMR 07/04/2001, 04/14/2004   Meningococcal Polysaccharide 02/14/2012   Pneumococcal Conjugate-13 09/29/2000   Pneumococcal-Unspecified 01/14/2000, 03/29/2000   Tdap 02/14/2012   Varicella 07/04/2000, 02/09/2008    Health Maintenance  Topic Date Due   HIV Screening  Never done   Hepatitis C Screening  Never done   INFLUENZA VACCINE  04/06/2022   PAP-Cervical Cytology Screening  04/08/2022 (Originally 06/25/2020)   PAP SMEAR-Modifier  04/08/2022 (Originally 06/25/2020)   TETANUS/TDAP  04/08/2022 (Originally 02/13/2022)   HPV VACCINES  Discontinued   COVID-19 Vaccine  Discontinued    Discussed health benefits of physical activity, and encouraged her to engage in regular exercise appropriate for her age and condition.  Problem List Items Addressed This Visit   None Visit Diagnoses     Annual physical exam    -  Primary   Cervical cancer screening       Need for Tdap vaccination          No follow-ups on file.     Kennya Schwenn, NP   

## 2020-06-12 ENCOUNTER — Ambulatory Visit (INDEPENDENT_AMBULATORY_CARE_PROVIDER_SITE_OTHER): Payer: BC Managed Care – PPO | Admitting: Medical-Surgical

## 2020-06-12 DIAGNOSIS — Z5329 Procedure and treatment not carried out because of patient's decision for other reasons: Secondary | ICD-10-CM

## 2021-05-07 LAB — HM MAMMOGRAPHY: HM Mammogram: NORMAL (ref 0–4)

## 2021-06-10 LAB — LIPID PANEL
Cholesterol: 256 — AB (ref 0–200)
HDL: 59 (ref 35–70)
LDL Cholesterol: 171
Triglycerides: 129 (ref 40–160)

## 2021-06-14 LAB — COMPREHENSIVE METABOLIC PANEL
Albumin: 5 (ref 3.5–5.0)
Calcium: 10.1 (ref 8.7–10.7)

## 2021-06-14 LAB — HEPATIC FUNCTION PANEL
ALT: 13 (ref 7–35)
AST: 17 (ref 13–35)
Alkaline Phosphatase: 66 (ref 25–125)
Bilirubin, Total: 0.9

## 2021-06-14 LAB — HEMOGLOBIN A1C: Hemoglobin A1C: 5.4

## 2021-06-14 LAB — VITAMIN B12: Vitamin B-12: 872

## 2021-06-14 LAB — BASIC METABOLIC PANEL
BUN: 22 — AB (ref 4–21)
CO2: 30 — AB (ref 13–22)
Chloride: 104 (ref 99–108)
Creatinine: 0.9 (ref 0.5–1.1)
Glucose: 90
Potassium: 4.2 (ref 3.4–5.3)
Sodium: 134 — AB (ref 137–147)

## 2021-06-14 LAB — CBC AND DIFFERENTIAL
HCT: 43 (ref 36–46)
Hemoglobin: 14.9 (ref 12.0–16.0)
Platelets: 277 (ref 150–399)
WBC: 5.6

## 2021-06-14 LAB — CBC: RBC: 4.73 (ref 3.87–5.11)

## 2021-06-14 LAB — VITAMIN D 25 HYDROXY (VIT D DEFICIENCY, FRACTURES): Vit D, 25-Hydroxy: 76.2

## 2021-06-14 LAB — IRON,TIBC AND FERRITIN PANEL: Ferritin: 59.1

## 2021-06-14 LAB — TSH: TSH: 0.54 (ref 0.41–5.90)

## 2021-07-20 ENCOUNTER — Other Ambulatory Visit: Payer: Self-pay

## 2021-07-20 ENCOUNTER — Encounter: Payer: Self-pay | Admitting: Medical-Surgical

## 2021-07-20 ENCOUNTER — Ambulatory Visit (INDEPENDENT_AMBULATORY_CARE_PROVIDER_SITE_OTHER): Payer: BC Managed Care – PPO | Admitting: Medical-Surgical

## 2021-07-20 VITALS — BP 107/74 | HR 68 | Resp 20 | Ht 63.0 in | Wt 175.0 lb

## 2021-07-20 DIAGNOSIS — Z7689 Persons encountering health services in other specified circumstances: Secondary | ICD-10-CM | POA: Diagnosis not present

## 2021-07-20 MED ORDER — PHENTERMINE HCL 8 MG PO TABS: 1.0000 | ORAL_TABLET | Freq: Every day | ORAL | 0 refills | Status: DC

## 2021-07-20 NOTE — Progress Notes (Signed)
  HPI with pertinent ROS:   CC: weight concerns  HPI: Very pleasant 55 year old female presenting today with concerns about her weight.  She was seen by me a little over a year ago with similar concerns and we did a trial of phentermine.  Unfortunately, her life has been quite chaotic and she has been extremely busy with 2 businesses as well as family concerns.  Today, she reports that she is at a point that she has to start worrying about herself.  She has tried multiple things for weight loss without results.  She did do Optavia but this was not helpful.  She is currently taking leptin synergy as directed by [facility].  She is interested in restarting phentermine although she would need a low-dose that she did not tolerate the higher doses last time.  I reviewed the past medical history, family history, social history, surgical history, and allergies today and no changes were needed.  Please see the problem list section below in epic for further details.   Physical exam:   General: Well Developed, well nourished, and in no acute distress.  Neuro: Alert and oriented x3.  HEENT: Normocephalic, atraumatic.  Skin: Warm and dry. Cardiac: Regular rate and rhythm, no murmurs rubs or gallops, no lower extremity edema.  Respiratory: Clear to auscultation bilaterally. Not using accessory muscles, speaking in full sentences.  Impression and Recommendations:    1. Encounter for weight management Restarting phentermine at 8 mg daily.  Discussed diet and exercise recommendations.  Her schedule is going to be a bit of a problem as she does long hours and has difficulty with eating dinner before 8 or 9:00 PM.  Discussed possibly using protein shakes and a healthy carbohydrate such as fruit as a meal supplement for dinner.  Also discussed the role of cortisol and stress and weight loss.  She will let me know if she would like to start something to help manage her stress.  Return in about 4 weeks  (around 08/17/2021) for weight check. ___________________________________________ Thayer Ohm, DNP, APRN, FNP-BC Primary Care and Sports Medicine [facility]

## 2021-07-20 NOTE — Patient Instructions (Signed)
Preventive Care 53-53 Years Old, Female Preventive care refers to lifestyle choices and visits with your health care provider that can promote health and wellness. Preventive care visits are also called wellness exams. What can I expect for my preventive care visit? Counseling Your health care provider may ask you questions about your: Medical history, including: Past medical problems. Family medical history. Pregnancy history. Current health, including: Menstrual cycle. Method of birth control. Emotional well-being. Home life and relationship well-being. Sexual activity and sexual health. Lifestyle, including: Alcohol, nicotine or tobacco, and drug use. Access to firearms. Diet, exercise, and sleep habits. Work and work Statistician. Sunscreen use. Safety issues such as seatbelt and bike helmet use. Physical exam Your health care provider will check your: Height and weight. These may be used to calculate your BMI (body mass index). BMI is a measurement that tells if you are at a healthy weight. Waist circumference. This measures the distance around your waistline. This measurement also tells if you are at a healthy weight and may help predict your risk of certain diseases, such as type 2 diabetes and high blood pressure. Heart rate and blood pressure. Body temperature. Skin for abnormal spots. What immunizations do I need? Vaccines are usually given at various ages, according to a schedule. Your health care provider will recommend vaccines for you based on your age, medical history, and lifestyle or other factors, such as travel or where you work. What tests do I need? Screening Your health care provider may recommend screening tests for certain conditions. This may include: Lipid and cholesterol levels. Diabetes screening. This is done by checking your blood sugar (glucose) after you have not eaten for a while (fasting). Pelvic exam and Pap test. Hepatitis B test. Hepatitis C  test. HIV (human immunodeficiency virus) test. STI (sexually transmitted infection) testing, if you are at risk. Lung cancer screening. Colorectal cancer screening. Mammogram. Talk with your health care provider about when you should start having regular mammograms. This may depend on whether you have a family history of breast cancer. BRCA-related cancer screening. This may be done if you have a family history of breast, ovarian, tubal, or peritoneal cancers. Bone density scan. This is done to screen for osteoporosis. Talk with your health care provider about your test results, treatment options, and if necessary, the need for more tests. Follow these instructions at home: Eating and drinking  Eat a diet that includes fresh fruits and vegetables, whole grains, lean protein, and low-fat dairy products. Take vitamin and mineral supplements as recommended by your health care provider. Do not drink alcohol if: Your health care provider tells you not to drink. You are pregnant, may be pregnant, or are planning to become pregnant. If you drink alcohol: Limit how much you have to 0-1 drink a day. Know how much alcohol is in your drink. In the [place] one drink equals one 12 oz bottle of beer (355 mL), one 5 oz glass of wine (148 mL), or one 1 oz glass of hard liquor (44 mL). Lifestyle Brush your teeth every morning and night with fluoride toothpaste. Floss one time each day. Exercise for at least 30 minutes 5 or more days each week. Do not use any products that contain nicotine or tobacco. These products include cigarettes, chewing tobacco, and vaping devices, such as e-cigarettes. If you need help quitting, ask your health care provider. Do not use drugs. If you are sexually active, practice safe sex. Use a condom or other form of protection to prevent  STIs. If you do not wish to become pregnant, use a form of birth control. If you plan to become pregnant, see your health care provider for a  prepregnancy visit. Take aspirin only as told by your health care provider. Make sure that you understand how much to take and what form to take. Work with your health care provider to find out whether it is safe and beneficial for you to take aspirin daily. Find healthy ways to manage stress, such as: Meditation, yoga, or listening to music. Journaling. Talking to a trusted person. Spending time with friends and family. Minimize exposure to UV radiation to reduce your risk of skin cancer. Safety Always wear your seat belt while driving or riding in a vehicle. Do not drive: If you have been drinking alcohol. Do not ride with someone who has been drinking. When you are tired or distracted. While texting. If you have been using any mind-altering substances or drugs. Wear a helmet and other protective equipment during sports activities. If you have firearms in your house, make sure you follow all gun safety procedures. Seek help if you have been physically or sexually abused. What's next? Visit your health care provider once a year for an annual wellness visit. Ask your health care provider how often you should have your eyes and teeth checked. Stay up to date on all vaccines. This information is not intended to replace advice given to you by your health care provider. Make sure you discuss any questions you have with your health care provider. Document Revised: 02/18/2021 Document Reviewed: 02/18/2021 Elsevier Patient Education  Kathleen Huffman.

## 2021-07-23 ENCOUNTER — Encounter: Payer: Self-pay | Admitting: Medical-Surgical

## 2021-08-19 ENCOUNTER — Encounter: Payer: Self-pay | Admitting: Medical-Surgical

## 2021-08-19 ENCOUNTER — Other Ambulatory Visit: Payer: Self-pay

## 2021-08-19 ENCOUNTER — Ambulatory Visit (INDEPENDENT_AMBULATORY_CARE_PROVIDER_SITE_OTHER): Payer: BC Managed Care – PPO | Admitting: Medical-Surgical

## 2021-08-19 VITALS — BP 104/69 | HR 78 | Resp 20 | Ht 63.0 in | Wt 173.3 lb

## 2021-08-19 DIAGNOSIS — Z7689 Persons encountering health services in other specified circumstances: Secondary | ICD-10-CM | POA: Diagnosis not present

## 2021-08-19 MED ORDER — BUPROPION HCL ER (XL) 150 MG PO TB24: 150.0000 mg | ORAL_TABLET | ORAL | 0 refills | Status: DC

## 2021-08-19 NOTE — Progress Notes (Signed)
°  HPI with pertinent ROS:   CC: weight management  HPI: Pleasant 55 year old female presenting today for weight check.  She has been using phentermine 8 mg tablets as prescribed however she noted that levofolinic milligram dose left her with significant insomnia.  She has been taking 4 mg (1/2 tablet) daily which allows her to sleep and does seem to provide some appetite suppression.  She notes that she is not craving sweets like she was.  Unfortunately, she had a period of illness where she stopped taking the medication because she was taking over-the-counter cold medicines.  She started the medication back 2 days ago.  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  She is working to limit her dietary intake and make sure she has enough protein during the day.  She does endorse considerable stress with all of the business and family issues. Not currently taking anything to help with mood or stress.   I reviewed the past medical history, family history, social history, surgical history, and allergies today and no changes were needed.  Please see the problem list section below in epic for further details.   Physical exam:   General: Well Developed, well nourished, and in no acute distress.  Neuro: Alert and oriented x3.  HEENT: Normocephalic, atraumatic.  Skin: Warm and dry. Cardiac: Regular rate and rhythm, no murmurs rubs or gallops, no lower extremity edema.  Respiratory: Clear to auscultation bilaterally. Not using accessory muscles, speaking in full sentences.  Impression and Recommendations:    1. Encounter for weight management Discussed use of phentermine for long-term weight loss.  She is likely having a placebo effect rather than any actual effect from the medication of 4 mg daily.  Given that this medication at higher doses interferes with her sleep, not sure if this is the best option for her.  With her  concurrent stress levels, she may benefit from off label use of Wellbutrin 150 mg daily.  She does endorse some inattention and difficulty focusing so this may actually be the best option for her.  Discussed risk versus benefits of the medication as well as potential side effects.  Patient would like to give it a try so sending in Wellbutrin 150 mg daily.  Stop phentermine for now until we know how she responds to Wellbutrin.  I will send her a MyChart message in about 3 weeks to see how she is doing on the new medication and we will plan to follow-up in office in 3 months.  Return in about 3 months (around 11/17/2021) for mood/stress follow up. ___________________________________________ Thayer Ohm, DNP, APRN, FNP-BC Primary Care and Sports Medicine [facility] [place]

## 2021-09-09 ENCOUNTER — Encounter: Payer: Self-pay | Admitting: Medical-Surgical

## 2021-10-20 ENCOUNTER — Ambulatory Visit (INDEPENDENT_AMBULATORY_CARE_PROVIDER_SITE_OTHER): Payer: BC Managed Care – PPO | Admitting: Sports Medicine

## 2021-10-20 ENCOUNTER — Other Ambulatory Visit: Payer: Self-pay

## 2021-10-20 ENCOUNTER — Ambulatory Visit (INDEPENDENT_AMBULATORY_CARE_PROVIDER_SITE_OTHER): Payer: BC Managed Care – PPO

## 2021-10-20 DIAGNOSIS — M722 Plantar fascial fibromatosis: Secondary | ICD-10-CM | POA: Diagnosis not present

## 2021-10-20 MED ORDER — MELOXICAM 15 MG PO TABS: ORAL_TABLET | ORAL | 3 refills | Status: DC

## 2021-10-20 NOTE — Progress Notes (Signed)
° ° °  Procedures performed today:    None.  Independent interpretation of notes and tests performed by another provider:   None.  Brief History, Exam, Impression, and Recommendations:    Plantar fasciitis, left A pleasant 56 year old female, she has had a few months of pain plantar aspect of the left heel, worse in the morning and worse when getting up from a seated position. She does some barefoot walking. On exam she has mild pes cavus, moderate tenderness at the plantar fascial origin. We discussed the pathophysiology and treatment protocol, adding meloxicam, x-rays, referral for custom molded orthotics, home conditioning, return to see me in about 6 weeks, injection if not better. She did have some left knee pain but feels as though this is simply secondary to abnormal gait from her planter fasciitis.  Chronic process with exacerbation and pharmacologic intervention  ___________________________________________ Ihor Austin. Benjamin Stain, M.D., ABFM., CAQSM. Primary Care and Sports Medicine [facility]  Adjunct Instructor of Family Medicine  [employer]

## 2021-10-20 NOTE — Assessment & Plan Note (Signed)
A pleasant 56 year old female, she has had a few months of pain plantar aspect of the left heel, worse in the morning and worse when getting up from a seated position. She does some barefoot walking. On exam she has mild pes cavus, moderate tenderness at the plantar fascial origin. We discussed the pathophysiology and treatment protocol, adding meloxicam, x-rays, referral for custom molded orthotics, home conditioning, return to see me in about 6 weeks, injection if not better. She did have some left knee pain but feels as though this is simply secondary to abnormal gait from her planter fasciitis.

## 2021-10-28 ENCOUNTER — Ambulatory Visit: Payer: BC Managed Care – PPO | Admitting: Family Medicine

## 2021-10-28 ENCOUNTER — Encounter: Payer: Self-pay | Admitting: Family Medicine

## 2021-10-28 DIAGNOSIS — M722 Plantar fascial fibromatosis: Secondary | ICD-10-CM

## 2021-10-28 NOTE — Assessment & Plan Note (Signed)
Acutely occurring.  Has a fairly cavus foot. -Counseled on home exercise therapy and supportive care. -Orthotics. -Could consider scaphoid pads.

## 2021-10-28 NOTE — Progress Notes (Signed)
°  Kathleen Huffman - 56 y.o. female MRN [id number]  Date of birth: 10/31/1965  SUBJECTIVE:  Including CC & ROS.  No chief complaint on file.   Kathleen Huffman is a 56 y.o. female that is presenting with foot pain.  The pain is worse when she is having the first few steps.  Pain is occurring at the plantar aspect of the heel.   Review of Systems See HPI   HISTORY: Past Medical, Surgical, Social, and Family History Reviewed & Updated per EMR.   Pertinent Historical Findings include:  Past Medical History:  Diagnosis Date   Hypothyroidism    SVD (spontaneous vaginal delivery)    x 1   Thyroid disease     Past Surgical History:  Procedure Laterality Date   DILATATION & CURETTAGE/HYSTEROSCOPY WITH MYOSURE N/A 12/10/2015   Procedure: DILATATION & CURETTAGE/HYSTEROSCOPY WITH MYOSURE;  Surgeon: Olivia Mackie, MD;  Location: [facility];  Service: Gynecology;  Laterality: N/A;   TUBAL LIGATION     WISDOM TOOTH EXTRACTION       PHYSICAL EXAM:  VS: Ht 5\' 3"  (1.6 m)    Wt 173 lb (78.5 kg)    LMP 07/10/2018    BMI 30.65 kg/m  Physical Exam Gen: NAD, alert, cooperative with exam, well-appearing MSK:  Neurovascularly intact    ECSWT Note Kathleen Huffman [birth year]  Patient was fitted for a standard, cushioned, semi-rigid orthotic. The orthotic was heated and afterward the patient stood on the orthotic blank positioned on the orthotic stand. The patient was positioned in subtalar neutral position and 10 degrees of ankle dorsiflexion in a weight bearing stance. After completion of molding, a stable base was applied to the orthotic blank. The blank was ground to a stable position for weight bearing. Size: 9 Pairs: 2 Base: Blue EVA Additional Posting and Padding: None The patient ambulated these, and they were very comfortable.     ASSESSMENT & PLAN:   Plantar fasciitis, left Acutely occurring.  Has a fairly cavus foot. -Counseled on home exercise therapy and supportive  care. -Orthotics. -Could consider scaphoid pads.

## 2021-11-16 NOTE — Progress Notes (Unsigned)
°  HPI with pertinent ROS:  ° °CC:  ° °HPI: ° ° °I reviewed the past medical history, family history, social history, surgical history, and allergies today and no changes were needed.  Please see the problem list section below in epic for further details. ° ° °Physical exam:  ° °General: Well Developed, well nourished, and in no acute distress.  °Neuro: Alert and oriented x3, extra-ocular muscles intact, sensation grossly intact.  °HEENT: Normocephalic, atraumatic, pupils equal round reactive to light, neck supple, no masses, no lymphadenopathy, thyroid nonpalpable.  °Skin: Warm and dry, no rashes. °Cardiac: Regular rate and rhythm, no murmurs rubs or gallops, no lower extremity edema.  °Respiratory: Clear to auscultation bilaterally. Not using accessory muscles, speaking in full sentences. ° °Impression and Recommendations:   ° °There are no diagnoses linked to this encounter. ° °No follow-ups on file. °___________________________________________ °Braiden Presutti L. Arnol Mcgibbon, DNP, APRN, FNP-BC °Primary Care and Sports Medicine °[facility] °

## 2021-11-17 ENCOUNTER — Ambulatory Visit (INDEPENDENT_AMBULATORY_CARE_PROVIDER_SITE_OTHER): Payer: Self-pay | Admitting: Medical-Surgical

## 2021-11-17 DIAGNOSIS — Z91199 Patient's noncompliance with other medical treatment and regimen due to unspecified reason: Secondary | ICD-10-CM

## 2021-12-01 ENCOUNTER — Other Ambulatory Visit: Payer: Self-pay

## 2021-12-01 ENCOUNTER — Ambulatory Visit (INDEPENDENT_AMBULATORY_CARE_PROVIDER_SITE_OTHER): Payer: BC Managed Care – PPO

## 2021-12-01 ENCOUNTER — Ambulatory Visit (INDEPENDENT_AMBULATORY_CARE_PROVIDER_SITE_OTHER): Payer: BC Managed Care – PPO | Admitting: Sports Medicine

## 2021-12-01 DIAGNOSIS — Z09 Encounter for follow-up examination after completed treatment for conditions other than malignant neoplasm: Secondary | ICD-10-CM | POA: Diagnosis not present

## 2021-12-01 DIAGNOSIS — M2242 Chondromalacia patellae, left knee: Secondary | ICD-10-CM

## 2021-12-01 DIAGNOSIS — M25562 Pain in left knee: Secondary | ICD-10-CM | POA: Diagnosis not present

## 2021-12-01 DIAGNOSIS — M722 Plantar fascial fibromatosis: Secondary | ICD-10-CM | POA: Diagnosis not present

## 2021-12-01 MED ORDER — ACETAMINOPHEN ER 650 MG PO TBCR: 650.0000 mg | EXTENDED_RELEASE_TABLET | Freq: Three times a day (TID) | ORAL | 3 refills | Status: AC | PRN

## 2021-12-01 NOTE — Assessment & Plan Note (Signed)
Resolved with conditioning, custom orthotics. ?

## 2021-12-01 NOTE — Assessment & Plan Note (Signed)
This is a very pleasant 56 year old female, we have been treating her for plantar fasciitis which has resolved, unfortunately she started to have increasing discomfort under her kneecap, clinically this is classic patellofemoral chondromalacia with patellar crepitus and painful patellar compression as well as tenderness at the medial patellar facet. ?We discussed the anatomy and pathophysiology, adding x-rays, she will do arthritis from Tylenol, meloxicam created dyspepsia. ?Home exercises given. ?She will also get an over-the-counter neoprene knee sleeve. ?Return to see me in 4 to 6 weeks as needed ?

## 2021-12-01 NOTE — Progress Notes (Signed)
? ? ?  Procedures performed today:   ? ?None. ? ?Independent interpretation of notes and tests performed by another provider:  ? ?None. ? ?Brief History, Exam, Impression, and Recommendations:   ? ?Chondromalacia of patellofemoral joint, left ?This is a very pleasant 56 year old female, we have been treating her for plantar fasciitis which has resolved, unfortunately she started to have increasing discomfort under her kneecap, clinically this is classic patellofemoral chondromalacia with patellar crepitus and painful patellar compression as well as tenderness at the medial patellar facet. ?We discussed the anatomy and pathophysiology, adding x-rays, she will do arthritis from Tylenol, meloxicam created dyspepsia. ?Home exercises given. ?She will also get an over-the-counter neoprene knee sleeve. ?Return to see me in 4 to 6 weeks as needed ? ?Plantar fasciitis, left ?Resolved with conditioning, custom orthotics. ? ? ? ?___________________________________________ ?Ihor Austin. Benjamin Stain, M.D., ABFM., CAQSM. ?Primary Care and Sports Medicine ?[name] ? ?Adjunct Instructor of Family Medicine  ?[employer] ?

## 2021-12-31 ENCOUNTER — Ambulatory Visit: Payer: BC Managed Care – PPO | Admitting: Medical-Surgical

## 2022-01-11 ENCOUNTER — Ambulatory Visit: Payer: BC Managed Care – PPO | Admitting: Sports Medicine

## 2022-10-26 LAB — HM MAMMOGRAPHY

## 2022-11-01 LAB — HM PAP SMEAR: HPV, high-risk: NEGATIVE

## 2022-11-05 ENCOUNTER — Encounter: Payer: Self-pay | Admitting: Medical-Surgical

## 2022-12-20 ENCOUNTER — Encounter: Payer: Self-pay | Admitting: *Deleted

## 2023-09-02 ENCOUNTER — Encounter: Payer: Self-pay | Admitting: Medical-Surgical

## 2023-09-02 ENCOUNTER — Telehealth (INDEPENDENT_AMBULATORY_CARE_PROVIDER_SITE_OTHER): Payer: BC Managed Care – PPO | Admitting: Medical-Surgical

## 2023-09-02 VITALS — BP 118/68 | HR 66 | Resp 20 | Ht 63.0 in | Wt 171.6 lb

## 2023-09-02 DIAGNOSIS — M722 Plantar fascial fibromatosis: Secondary | ICD-10-CM | POA: Diagnosis not present

## 2023-09-02 DIAGNOSIS — F439 Reaction to severe stress, unspecified: Secondary | ICD-10-CM

## 2023-09-02 DIAGNOSIS — J0100 Acute maxillary sinusitis, unspecified: Secondary | ICD-10-CM

## 2023-09-02 MED ORDER — HYDROXYZINE HCL 10 MG PO TABS: 10.0000 mg | ORAL_TABLET | Freq: Three times a day (TID) | ORAL | 0 refills | Status: DC | PRN

## 2023-09-02 MED ORDER — METHYLPREDNISOLONE 4 MG PO TBPK: ORAL_TABLET | ORAL | 0 refills | Status: DC

## 2023-09-02 NOTE — Progress Notes (Signed)
        Established patient visit  History, exam, impression, and plan:  1. Plantar fasciitis, left (Primary) Pleasant 57-year-old female presenting today with a history of plantar fasciitis.  She was sent to sports medicine where she had orthotics made in 2023.  She has been wearing these daily and reports that her symptoms have completely resolved.  Unfortunately, the orthotics have become worn down with daily use and she is in need of a new set.  Previously saw Dr. Jordan Likes but he is no longer at the practice.  Referral placed for sports medicine to renew orthotics. - Ambulatory referral to Sports Medicine  2. 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  Notes that in her stress, she will often overeat and is interested in something that will help with symptoms overall.  She does not want to take anything on a daily basis but is open to an as needed option.  Starting hydroxyzine 10 mg 3 times daily as needed to see if this is beneficial.  If not, we can consider BuSpar.  3. Acute non-recurrent maxillary sinusitis For the last several weeks she has been battling respiratory infection that originally started with sore throat and chest congestion.  She was treated by a different provider with azithromycin that she completed on Christmas Day.  Unfortunately, she has had the chest congestion cleared up but is now experiencing sinus congestion and pressure.  Worse at night and in the morning and then clears some during the day.  Using over-the-counter medications for symptom management.  Given persistence of symptoms, adding Medrol Dosepak today.   Procedures performed this visit: None.  Return in about 1 year (around 09/01/2024) for annual physical exam or sooner if needed.  __________________________________ Thayer Ohm, DNP, APRN, FNP-BC Primary Care and Sports Medicine [facility]  [place]

## 2023-09-15 ENCOUNTER — Other Ambulatory Visit: Payer: Self-pay | Admitting: Sports Medicine

## 2023-09-15 DIAGNOSIS — M722 Plantar fascial fibromatosis: Secondary | ICD-10-CM

## 2023-09-16 ENCOUNTER — Ambulatory Visit: Payer: BC Managed Care – PPO | Admitting: Sports Medicine

## 2023-09-22 ENCOUNTER — Encounter: Payer: Self-pay | Admitting: Family Medicine

## 2023-09-22 ENCOUNTER — Ambulatory Visit: Payer: BC Managed Care – PPO | Admitting: Family Medicine

## 2023-09-22 VITALS — BP 124/80 | Ht 63.0 in | Wt 170.0 lb

## 2023-09-22 DIAGNOSIS — M722 Plantar fascial fibromatosis: Secondary | ICD-10-CM

## 2023-09-22 DIAGNOSIS — M2242 Chondromalacia patellae, left knee: Secondary | ICD-10-CM | POA: Diagnosis not present

## 2023-09-22 NOTE — Assessment & Plan Note (Signed)
Custom orthotics fabricated today as noted above.

## 2023-09-22 NOTE — Progress Notes (Signed)
DATE OF VISIT: 09/22/2023        Kathleen Huffman DOB: 1965/10/26 MRN: [id number]  CC:  Custom orthotics  History of present Illness: Kathleen Huffman is a 58 y.o. 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   Ascorbic Acid (VITAMIN C ER PO) Take by mouth.   Bioflavonoid Products (GRAPE SEED PO) Take by mouth.   cetirizine (ZYRTEC) 10 MG tablet Take 5 mg by mouth daily.   Cholecalciferol (VITAMIN D3) 5000 units CAPS Take 1 capsule by mouth daily.   co-enzyme Q-10 30 MG capsule Take 30 mg by mouth 3 (three) times daily.   Cyanocobalamin (VITAMIN B-12 PO) Take 1 tablet by mouth 2 (two) times daily.    estradiol (VIVELLE-DOT) 0.0375 MG/24HR Place 1 patch onto the skin every 3 (three) days.   hydrOXYzine (ATARAX) 10 MG tablet Take 1 tablet (10 mg total) by mouth 3 (three) times daily as needed.   Menaquinone-7 (VITAMIN K2 PO) Take by mouth.   methylPREDNISolone (MEDROL DOSEPAK) 4 MG TBPK tablet Take as directed.   Omega-3 Fatty Acids (OMEGA 3 PO) Take 1 capsule by mouth daily.   progesterone (PROMETRIUM) 100 MG capsule TAKE 1 CAPSULE BY MOUTH AT BEDTIME STOP FOR MENSES   thyroid (ARMOUR) 65 MG tablet Take 32.5-65 mg by mouth 2 (two) times daily. 65 mg in the morning and 32.5 mg at night   No facility-administered encounter medications on file as of 09/22/2023.    Allergies: is allergic to codeine, flexeril [cyclobenzaprine], fluogen [influenza virus vaccine], sulfa antibiotics, and latex.  Physical  Examination: Vitals: BP 124/80   Ht 5\' 3"  (1.6 m)   Wt 170 lb (77.1 kg)   LMP 07/10/2018   BMI 30.11 kg/m  GENERAL:  Kathleen Huffman is a 58 y.o. female appearing their stated age, alert and oriented x 3, in no apparent distress.  SKIN: no rashes or lesions, skin clean, dry, intact MSK: moderate long arches bilaterally.  Slight widening of Rt transverse arch with slight splaying between 1st & 2nd toes.  Preserved transverse arch on the left No leg length difference Normal gait N/V/I distally  Assessment & Plan Plantar fasciitis, left Custom orthotics fabricated today  Patient was fitted for a : Fit and Run semi-rigid orthotic  (2) sets of orthotics made for patient today The orthotic was heated, placed on the orthotic stand. The patient was positioned in subtalar neutral position and 10 degrees of ankle dorsiflexion in a weight bearing stance on the heated orthotic blank After completion of molding Blank: Fit 'n Run - size 9 Posting:  none Base: none  Orthotics comfortable and patient in neutral position with orthotics She will f/u with PCP and Dr Shon Baton as scheduled F/u with [place] prn. Chondromalacia of patellofemoral joint, left Custom orthotics fabricated today as noted above.   Patient expressed understanding & agreement with above.  Encounter Diagnoses  Name Primary?   Plantar fasciitis, left Yes   Chondromalacia of patellofemoral joint, left  No orders of the defined types were placed in this encounter.

## 2023-09-22 NOTE — Patient Instructions (Signed)
 Thank you for coming to see me today. It was a pleasure.   We made you new orthotics. Let us know if we need to add any extra cushioning or make changes.  If you have any questions or concerns, please do not hesitate to call the office at [phone].

## 2023-09-22 NOTE — Assessment & Plan Note (Addendum)
Custom orthotics fabricated today  Patient was fitted for a : Fit and Run semi-rigid orthotic  (2) sets of orthotics made for patient today The orthotic was heated, placed on the orthotic stand. The patient was positioned in subtalar neutral position and 10 degrees of ankle dorsiflexion in a weight bearing stance on the heated orthotic blank After completion of molding Blank: Fit 'n Run - size 9 Posting:  none Base: none  Orthotics comfortable and patient in neutral position with orthotics She will f/u with PCP and Dr Shon Baton as scheduled F/u with [place] prn.

## 2023-10-12 ENCOUNTER — Ambulatory Visit
Admission: RE | Admit: 2023-10-12 | Discharge: 2023-10-12 | Disposition: A | Discharge status: Home / Self Care | Payer: BC Managed Care – PPO | Source: Ambulatory Visit | Attending: Family Medicine | Admitting: Family Medicine

## 2023-10-12 VITALS — BP 120/82 | HR 75 | Temp 97.9°F | Resp 17

## 2023-10-12 DIAGNOSIS — J101 Influenza due to other identified influenza virus with other respiratory manifestations: Secondary | ICD-10-CM

## 2023-10-12 LAB — POCT INFLUENZA A/B
Influenza A, POC: POSITIVE — AB
Influenza B, POC: NEGATIVE

## 2023-10-12 MED ORDER — OSELTAMIVIR PHOSPHATE 75 MG PO CAPS: 75.0000 mg | ORAL_CAPSULE | Freq: Two times a day (BID) | ORAL | 0 refills | Status: DC

## 2023-10-12 NOTE — Discharge Instructions (Signed)
 Take the Tamiflu  2 times a day for 5 days.  Take 2 doses today May take Tylenol  or ibuprofen  for pain and fever May take over-the-counter cough or cold medicines as needed

## 2023-10-12 NOTE — ED Triage Notes (Signed)
 Pt c/o cough, congestion and bodyaches that started yesterday. Exposure to flu recently. Taking tylenol  and dayquil prn.

## 2023-10-12 NOTE — ED Provider Notes (Signed)
 [facility]    CSN: [id number] Arrival date & time: 10/12/23  0940      History   Chief Complaint Chief Complaint  Patient presents with   Cough   Nasal Congestion   Generalized Body Aches    HPI Kathleen Huffman is a 58 y.o. female.   HPI  Patient has been exposed to influenza recently.  She is here because she has cough and congestion, headache and bodyaches that started yesterday.  Feels very tired.  Thinks she may have the flu  Past Medical History:  Diagnosis Date   Hypothyroidism    SVD (spontaneous vaginal delivery)    x 1   Thyroid disease     Patient Active Problem List   Diagnosis Date Noted   Chondromalacia of patellofemoral joint, left 12/01/2021   Plantar fasciitis, left 10/20/2021   Colon spasm 07/27/2018   Hypothyroidism 03/21/2015   Class 1 obesity due to excess calories without serious comorbidity with body mass index (BMI) of 31.0 to 31.9 in adult 03/21/2015    Past Surgical History:  Procedure Laterality Date   DILATATION & CURETTAGE/HYSTEROSCOPY WITH MYOSURE N/A 12/10/2015   Procedure: DILATATION & CURETTAGE/HYSTEROSCOPY WITH MYOSURE;  Surgeon: Charlie Flowers, MD;  Location: [facility];  Service: Gynecology;  Laterality: N/A;   TUBAL LIGATION     WISDOM TOOTH EXTRACTION      OB History   No obstetric history on file.      Home Medications    Prior to Admission medications   Medication Sig Start Date End Date Taking? Authorizing Provider  oseltamivir  (TAMIFLU ) 75 MG capsule Take 1 capsule (75 mg total) by mouth every 12 (twelve) hours. 10/12/23  Yes Maranda Jamee Jacob, MD  acetaminophen  (TYLENOL ) 650 MG CR tablet Take 1 tablet (650 mg total) by mouth every 8 (eight) hours as needed for pain. 12/01/21   Curtis Debby PARAS, MD  Ascorbic Acid (VITAMIN C ER PO) Take by mouth.    [provider]  Bioflavonoid Products (GRAPE SEED PO) Take by mouth.    [provider]  cetirizine  (ZYRTEC ) 10 MG tablet Take 5 mg by  mouth daily.    [provider]  Cholecalciferol (VITAMIN D3) 5000 units CAPS Take 1 capsule by mouth daily.    [provider]  co-enzyme Q-10 30 MG capsule Take 30 mg by mouth 3 (three) times daily.    [provider]  Cyanocobalamin (VITAMIN B-12 PO) Take 1 tablet by mouth 2 (two) times daily.     [provider]  estradiol  (VIVELLE -DOT) 0.0375 MG/24HR Place 1 patch onto the skin every 3 (three) days. 03/25/20   [provider]  hydrOXYzine  (ATARAX ) 10 MG tablet Take 1 tablet (10 mg total) by mouth 3 (three) times daily as needed. 09/02/23   Willo Mini, NP  Menaquinone-7 (VITAMIN K2 PO) Take by mouth.    [provider]  Omega-3 Fatty Acids (OMEGA 3 PO) Take 1 capsule by mouth daily.    [provider]  progesterone (PROMETRIUM) 100 MG capsule TAKE 1 CAPSULE BY MOUTH AT BEDTIME STOP FOR MENSES 04/10/18   [provider]  thyroid (ARMOUR) 65 MG tablet Take 32.5-65 mg by mouth 2 (two) times daily. 65 mg in the morning and 32.5 mg at night    [provider]    Family History Family History  Problem Relation Age of Onset   Hypertension Mother    Alcohol abuse Father    Hyperlipidemia Father  Alcohol abuse Brother    Diabetes Maternal Uncle    Cancer Maternal Grandmother    Hyperlipidemia Maternal Grandfather    Colon polyps Paternal Grandmother    Colon cancer Neg Hx    Esophageal cancer Neg Hx    Rectal cancer Neg Hx    Stomach cancer Neg Hx     Social History Social History   Tobacco Use   Smoking status: Former    Current packs/day: 0.00    Average packs/day: 1 pack/day for 10.0 years (10.0 ttl pk-yrs)    Types: Cigarettes    Start date: 09/06/1990    Quit date: 09/06/2000    Years since quitting: 23.1   Smokeless tobacco: Never  Vaping Use   Vaping status: Never Used  Substance Use Topics   Alcohol use: Not Currently    Alcohol/week: 0.0 standard drinks of alcohol   Drug use: No      Allergies   Codeine, Flexeril [cyclobenzaprine], Fluogen [influenza virus vaccine], Sulfa antibiotics, and Latex   Review of Systems Review of Systems See HPI  Physical Exam Triage Vital Signs ED Triage Vitals [10/12/23 1008]  Encounter Vitals Group     BP 120/82     Systolic BP Percentile      Diastolic BP Percentile      Pulse Rate 75     Resp 17     Temp 97.9 F (36.6 C)     Temp Source Oral     SpO2 96 %     Weight      Height      Head Circumference      Peak Flow      Pain Score 0     Pain Loc      Pain Education      Exclude from Growth Chart    No data found.  Updated Vital Signs BP 120/82 (BP Location: Right Arm)   Pulse 75   Temp 97.9 F (36.6 C) (Oral)   Resp 17   LMP 07/10/2018   SpO2 96%       Physical Exam Constitutional:      General: She is not in acute distress.    Appearance: She is well-developed. She is ill-appearing.  HENT:     Head: Normocephalic and atraumatic.     Mouth/Throat:     Mouth: Mucous membranes are moist.     Pharynx: No posterior oropharyngeal erythema.  Eyes:     Conjunctiva/sclera: Conjunctivae normal.     Pupils: Pupils are equal, round, and reactive to light.  Cardiovascular:     Rate and Rhythm: Normal rate.     Heart sounds: Normal heart sounds.  Pulmonary:     Effort: Pulmonary effort is normal. No respiratory distress.     Breath sounds: Normal breath sounds.  Abdominal:     General: There is no distension.     Palpations: Abdomen is soft.  Musculoskeletal:        General: Normal range of motion.     Cervical back: Normal range of motion.  Skin:    General: Skin is warm and dry.  Neurological:     Mental Status: She is alert.      UC Treatments / Results  Labs (all labs ordered are listed, but only abnormal results are displayed) Labs Reviewed  POCT INFLUENZA A/B - Abnormal; Notable for the following components:      Result Value   Influenza A, POC Positive (*)    All other components  within normal limits    EKG   Radiology No results found.  Procedures Procedures (including critical care time)  Medications Ordered in UC Medications - No data to display  Initial Impression / Assessment and Plan / UC Course  I have reviewed the triage vital signs and the nursing notes.  Pertinent labs & imaging results that were available during my care of the patient were reviewed by me and considered in my medical decision making (see chart for details).     Final Clinical Impressions(s) / UC Diagnoses   Final diagnoses:  Influenza A     Discharge Instructions      Take the Tamiflu  2 times a day for 5 days.  Take 2 doses today May take Tylenol  or ibuprofen  for pain and fever May take over-the-counter cough or cold medicines as needed   ED Prescriptions     Medication Sig Dispense Auth. Provider   oseltamivir  (TAMIFLU ) 75 MG capsule Take 1 capsule (75 mg total) by mouth every 12 (twelve) hours. 10 capsule Maranda Jamee Jacob, MD      PDMP not reviewed this encounter.   Maranda Jamee Jacob, MD 10/12/23 [phone]

## 2023-11-07 ENCOUNTER — Ambulatory Visit

## 2023-11-08 LAB — HM MAMMOGRAPHY

## 2023-11-12 IMAGING — DX DG KNEE COMPLETE 4+V*L*
4 series · 4 of 4 positions shown · non-contrast
Comparison: None.

CLINICAL DATA: Left knee pain.

EXAM:
LEFT KNEE - COMPLETE 4+ VIEW

[knee lat]
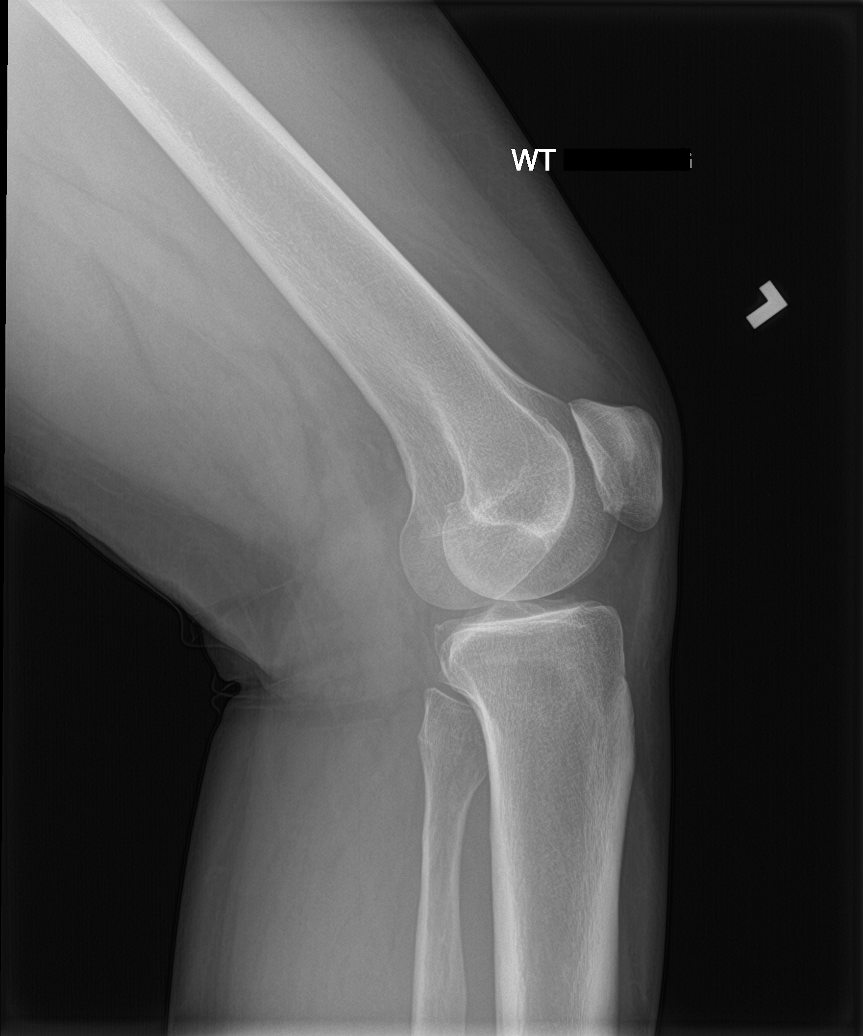

[knee ap bilat standing]
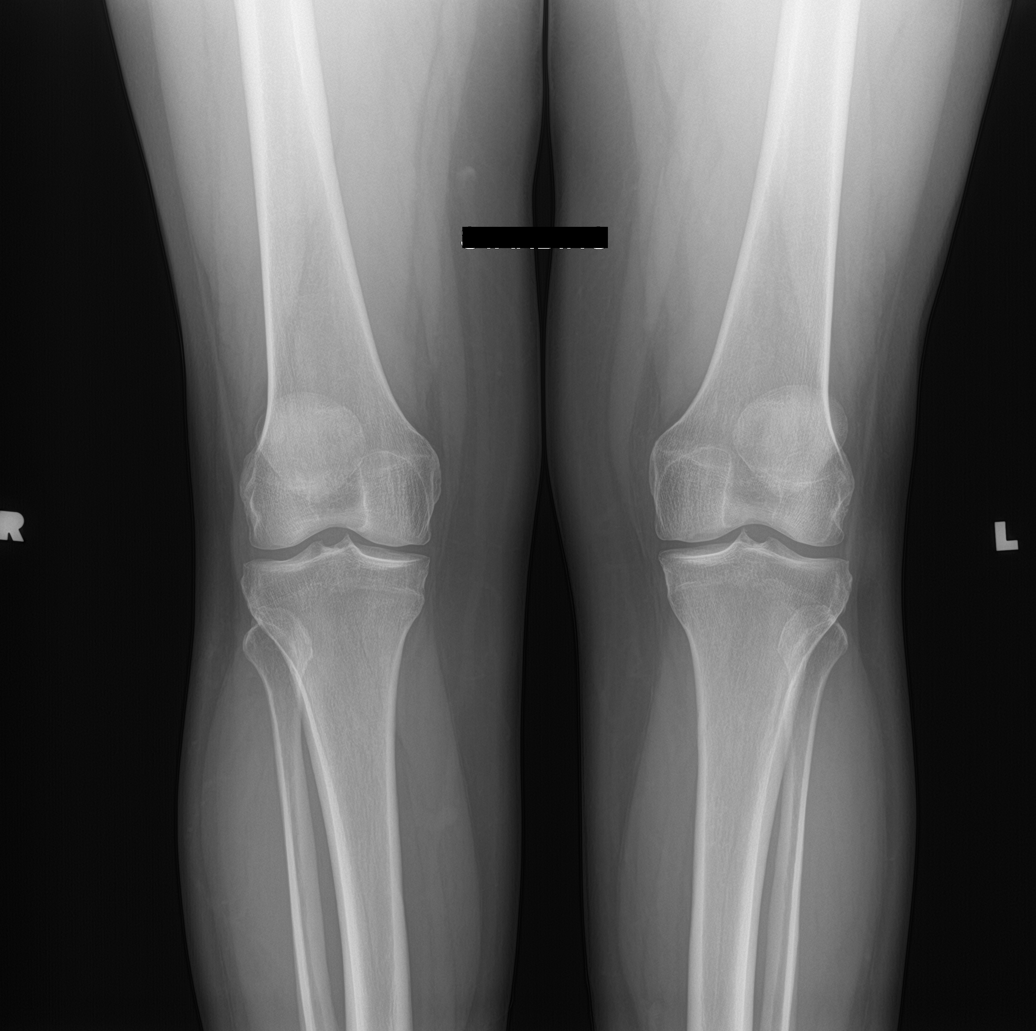

[knee sunrise standing]
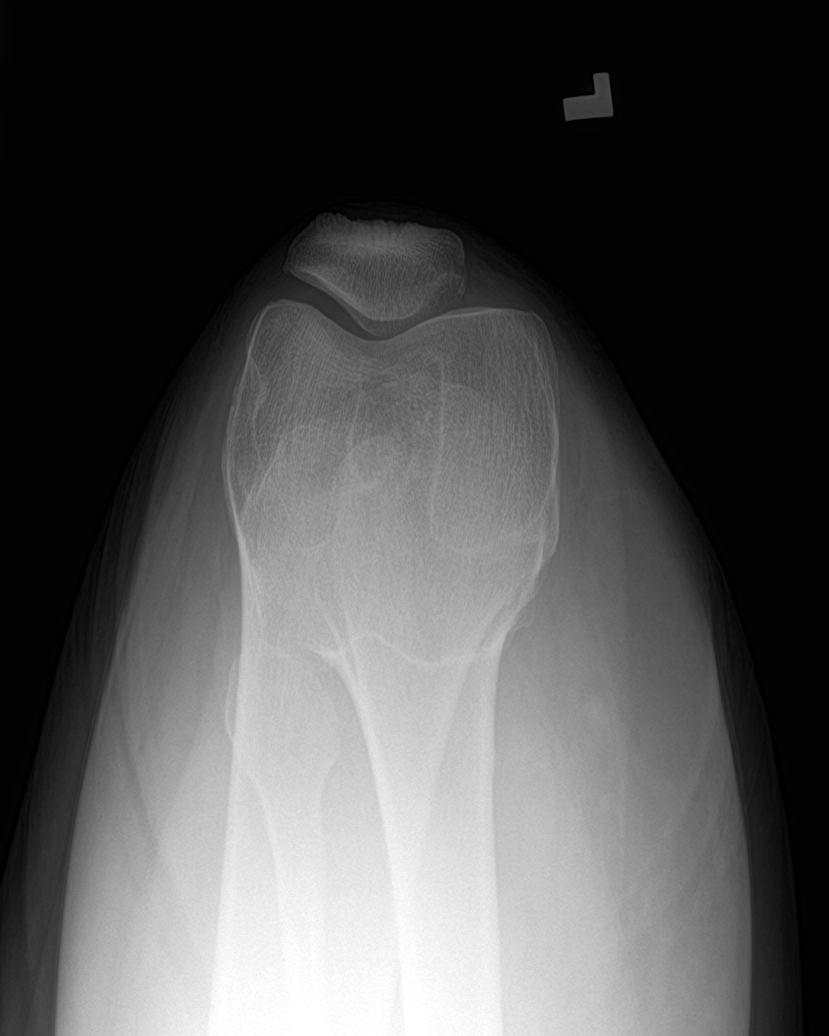

[knee tunnel]
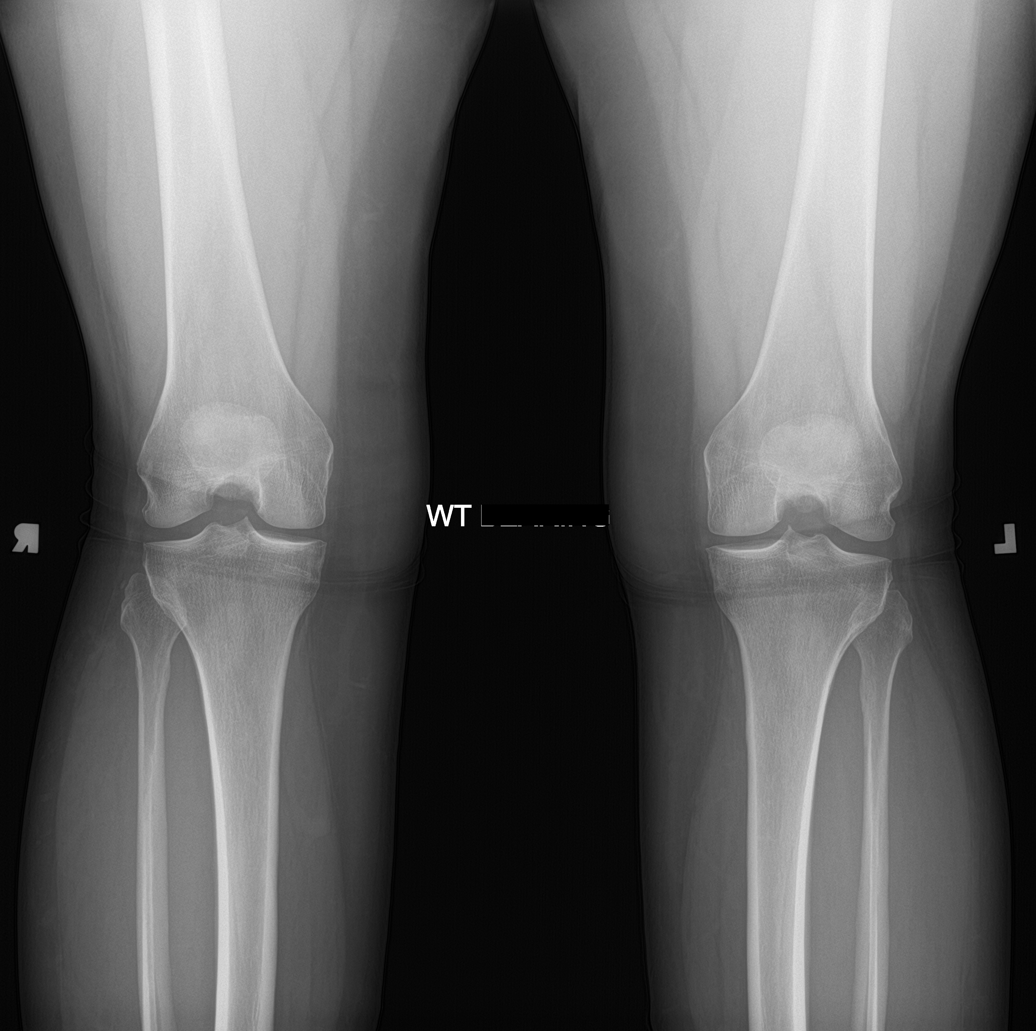

[4 of 4 positions shown; findings below may reference images not displayed]

FINDINGS: No evidence of fracture, dislocation, or joint effusion. No evidence
of arthropathy or other focal bone abnormality. Soft tissues are
unremarkable.
IMPRESSION: Negative.

## 2024-04-30 ENCOUNTER — Ambulatory Visit: Payer: Self-pay

## 2024-04-30 ENCOUNTER — Ambulatory Visit
Admission: RE | Admit: 2024-04-30 | Discharge: 2024-04-30 | Disposition: A | Discharge status: Home / Self Care | Source: Ambulatory Visit | Attending: Nurse Practitioner | Admitting: Nurse Practitioner

## 2024-04-30 ENCOUNTER — Other Ambulatory Visit: Payer: Self-pay

## 2024-04-30 VITALS — BP 122/77 | HR 65 | Temp 98.0°F | Resp 16

## 2024-04-30 DIAGNOSIS — J309 Allergic rhinitis, unspecified: Secondary | ICD-10-CM | POA: Diagnosis not present

## 2024-04-30 LAB — POC SARS CORONAVIRUS 2 AG -  ED: SARS Coronavirus 2 Ag: NEGATIVE

## 2024-04-30 MED ORDER — PREDNISONE 20 MG PO TABS: 40.0000 mg | ORAL_TABLET | Freq: Every day | ORAL | 0 refills | Status: AC

## 2024-04-30 MED ORDER — HYDROCOD POLI-CHLORPHE POLI ER 10-8 MG/5ML PO SUER: 5.0000 mL | Freq: Every day | ORAL | 0 refills | Status: DC

## 2024-04-30 MED ORDER — FLUTICASONE PROPIONATE 50 MCG/ACT NA SUSP: 2.0000 | Freq: Every day | NASAL | 0 refills | Status: AC

## 2024-04-30 MED ORDER — PSEUDOEPH-BROMPHEN-DM 30-2-10 MG/5ML PO SYRP: 10.0000 mL | ORAL_SOLUTION | Freq: Four times a day (QID) | ORAL | 0 refills | Status: DC | PRN

## 2024-04-30 MED ORDER — CETIRIZINE-PSEUDOEPHEDRINE ER 5-120 MG PO TB12: 1.0000 | ORAL_TABLET | Freq: Every day | ORAL | 0 refills | Status: AC

## 2024-04-30 NOTE — ED Provider Notes (Signed)
 [facility]    CSN: [id number] Arrival date & time: 04/30/24  1017      History   Chief Complaint Chief Complaint  Patient presents with   Cough   Sore Throat    HPI Kathleen Huffman is a 58 y.o. female.   Discussed the use of AI scribe software for clinical note transcription with the patient, who gave verbal consent to proceed.   Patient presents with persistent cough and sore throat. 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 The following day, she was exposed to alfalfa hay dust. That night, the patient began coughing, which has persisted since then, being more pronounced at night. This morning, the patient woke up with an unusual sore throat and generally feeling unwell. The cough has been ongoing for 5 days, worsening at bedtime when laying down. The patient denies fever, chills, body aches, nasal congestion, runny nose, sneezing, wheezing, shortness of breath, nausea, vomiting, or diarrhea. She experienced a headache for a couple of days, which was relieved by Tylenol . This morning, the patient noticed ear itching and a sore throat, though the latter has improved throughout the day. The patient attributes her symptoms to the gnat ingestion and alfalfa hay exposure. She also mowed the lawn at the end of the week. She has attempted self-treatment with Alka-Seltzer Plus yesterday, which provided some relief, and Nyquil last week, to which she reports being sensitive. 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 The patient denies smoking or vaping, and is not taking any medications for high blood pressure, diabetes, or cholesterol. She also deny having kidney disease.  The following portions of the patient's history were reviewed and updated as appropriate: allergies, current medications, past family history, past medical history, past social history, past surgical history, and problem list.    Past Medical  History:  Diagnosis Date   Hypothyroidism    SVD (spontaneous vaginal delivery)    x 1   Thyroid disease     Patient Active Problem List   Diagnosis Date Noted   Chondromalacia of patellofemoral joint, left 12/01/2021   Plantar fasciitis, left 10/20/2021   Colon spasm 07/27/2018   Hypothyroidism 03/21/2015   Class 1 obesity due to excess calories without serious comorbidity with body mass index (BMI) of 31.0 to 31.9 in adult 03/21/2015    Past Surgical History:  Procedure Laterality Date   DILATATION & CURETTAGE/HYSTEROSCOPY WITH MYOSURE N/A 12/10/2015   Procedure: DILATATION & CURETTAGE/HYSTEROSCOPY WITH MYOSURE;  Surgeon: Charlie Flowers, MD;  Location: [facility];  Service: Gynecology;  Laterality: N/A;   TUBAL LIGATION     WISDOM TOOTH EXTRACTION      OB History   No obstetric history on file.      Home Medications    Prior to Admission medications   Medication Sig Start Date End Date Taking? Authorizing Provider  brompheniramine-pseudoephedrine -DM 30-2-10 MG/5ML syrup Take 10 mLs by mouth every 6 (six) hours as needed (cough and congestion). 04/30/24  Yes Iola Lukes, FNP  cetirizine -pseudoephedrine  (ZYRTEC -D) 5-120 MG tablet Take 1 tablet by mouth daily with breakfast for 10 days. 04/30/24 05/10/24 Yes Iola Lukes, FNP  chlorpheniramine-HYDROcodone  (TUSSIONEX) 10-8 MG/5ML Take 5 mLs by mouth at bedtime. 04/30/24  Yes Iola Lukes, FNP  fluticasone  (FLONASE ) 50 MCG/ACT nasal spray Place 2 sprays into both nostrils daily. Shake well before use. Gently blow nose before spraying. Do not blow nose immediately after use. You should not taste the  medication or feel it going down your throat; if you do, adjust your technique. 04/30/24  Yes Lova Urbieta, FNP  predniSONE  (DELTASONE ) 20 MG tablet Take 2 tablets (40 mg total) by mouth daily for 5 days. 04/30/24 05/05/24 Yes Iola Lukes, FNP  acetaminophen  (TYLENOL ) 650 MG CR tablet Take 1 tablet (650 mg total) by mouth  every 8 (eight) hours as needed for pain. 12/01/21   Curtis Debby PARAS, MD  Ascorbic Acid (VITAMIN C ER PO) Take by mouth.    [provider]  Bioflavonoid Products (GRAPE SEED PO) Take by mouth.    [provider]  Cholecalciferol (VITAMIN D3) 5000 units CAPS Take 1 capsule by mouth daily.    [provider]  co-enzyme Q-10 30 MG capsule Take 30 mg by mouth 3 (three) times daily.    [provider]  Cyanocobalamin (VITAMIN B-12 PO) Take 1 tablet by mouth 2 (two) times daily.     [provider]  estradiol  (VIVELLE -DOT) 0.0375 MG/24HR Place 1 patch onto the skin every 3 (three) days. 03/25/20   [provider]  Menaquinone-7 (VITAMIN K2 PO) Take by mouth.    [provider]  Omega-3 Fatty Acids (OMEGA 3 PO) Take 1 capsule by mouth daily.    [provider]  progesterone (PROMETRIUM) 100 MG capsule TAKE 1 CAPSULE BY MOUTH AT BEDTIME STOP FOR MENSES 04/10/18   [provider]  thyroid (ARMOUR) 65 MG tablet Take 32.5-65 mg by mouth 2 (two) times daily. 65 mg in the morning and 32.5 mg at night    [provider]    Family History Family History  Problem Relation Age of Onset   Hypertension Mother    Alcohol abuse Father    Hyperlipidemia Father    Alcohol abuse Brother    Diabetes Maternal Uncle    Cancer Maternal Grandmother    Hyperlipidemia Maternal Grandfather    Colon polyps Paternal Grandmother    Colon cancer Neg Hx    Esophageal cancer Neg Hx    Rectal cancer Neg Hx    Stomach cancer Neg Hx     Social History Social History   Tobacco Use   Smoking status: Former    Current packs/day: 0.00    Average packs/day: 1 pack/day for 10.0 years (10.0 ttl pk-yrs)    Types: Cigarettes    Start date: 09/06/1990    Quit date: 09/06/2000    Years since quitting: 23.6   Smokeless tobacco: Never  Vaping Use   Vaping status: Never Used  Substance Use Topics   Alcohol use: Not Currently     Alcohol/week: 0.0 standard drinks of alcohol   Drug use: No     Allergies   Codeine, Flexeril [cyclobenzaprine], Fluogen [influenza virus vaccine], Sulfa antibiotics, and Latex   Review of Systems Review of Systems  Constitutional:  Negative for chills, diaphoresis, fatigue and fever.  HENT:  Positive for sore throat. Negative for congestion, ear pain, postnasal drip, rhinorrhea and sneezing.   Respiratory:  Positive for cough. Negative for shortness of breath and wheezing.   Gastrointestinal:  Negative for diarrhea, nausea and vomiting.  Neurological:  Positive for headaches.  All other systems reviewed and are negative.    Physical Exam Triage Vital Signs ED Triage Vitals  Encounter Vitals Group     BP 04/30/24 1055 122/77     Girls Systolic BP Percentile --      Girls Diastolic BP Percentile --      Boys Systolic  BP Percentile --      Boys Diastolic BP Percentile --      Pulse Rate 04/30/24 1055 65     Resp 04/30/24 1055 16     Temp 04/30/24 1055 98 F (36.7 C)     Temp src --      SpO2 04/30/24 1055 97 %     Weight --      Height --      Head Circumference --      Peak Flow --      Pain Score 04/30/24 1059 2     Pain Loc --      Pain Education --      Exclude from Growth Chart --    No data found.  Updated Vital Signs BP 122/77   Pulse 65   Temp 98 F (36.7 C)   Resp 16   LMP 07/10/2018   SpO2 97%   Visual Acuity Right Eye Distance:   Left Eye Distance:   Bilateral Distance:    Right Eye Near:   Left Eye Near:    Bilateral Near:     Physical Exam Vitals reviewed.  Constitutional:      General: She is awake. She is not in acute distress.    Appearance: Normal appearance. She is well-developed. She is not ill-appearing, toxic-appearing or diaphoretic.  HENT:     Head: Normocephalic.     Right Ear: Hearing, tympanic membrane, ear canal and external ear normal. No drainage, swelling or tenderness. No middle ear effusion. Tympanic membrane is not  erythematous.     Left Ear: Hearing, tympanic membrane, ear canal and external ear normal. No drainage, swelling or tenderness.  No middle ear effusion. Tympanic membrane is not erythematous.     Nose: No congestion or rhinorrhea.     Right Sinus: No maxillary sinus tenderness or frontal sinus tenderness.     Left Sinus: No maxillary sinus tenderness or frontal sinus tenderness.     Mouth/Throat:     Lips: Pink.     Mouth: Mucous membranes are moist.     Pharynx: Oropharynx is clear. Uvula midline. No pharyngeal swelling, oropharyngeal exudate, posterior oropharyngeal erythema or uvula swelling.     Tonsils: No tonsillar exudate or tonsillar abscesses.  Eyes:     General: Vision grossly intact.     Conjunctiva/sclera: Conjunctivae normal.  Cardiovascular:     Rate and Rhythm: Normal rate and regular rhythm.     Heart sounds: Normal heart sounds.  Pulmonary:     Effort: Pulmonary effort is normal.     Breath sounds: Normal breath sounds and air entry.  Musculoskeletal:        General: Normal range of motion.     Cervical back: Full passive range of motion without pain, normal range of motion and neck supple.  Lymphadenopathy:     Cervical: Cervical adenopathy present.  Skin:    General: Skin is warm and dry.  Neurological:     General: No focal deficit present.     Mental Status: She is alert and oriented to person, place, and time.  Psychiatric:        Mood and Affect: Mood normal.        Behavior: Behavior normal. Behavior is cooperative.      UC Treatments / Results  Labs (all labs ordered are listed, but only abnormal results are displayed) Labs Reviewed  POC SARS CORONAVIRUS 2 AG -  ED    EKG   Radiology  No results found.  Procedures Procedures (including critical care time)  Medications Ordered in UC Medications - No data to display  Initial Impression / Assessment and Plan / UC Course  I have reviewed the triage vital signs and the nursing  notes.  Pertinent labs & imaging results that were available during my care of the patient were reviewed by me and considered in my medical decision making (see chart for details).    The patient is afebrile and nontoxic on examination. COVID tests were negative. Physical exam findings are documented above. Presentation is most consistent with acute sinusitis likely secondary to allergies. Antibiotics are not indicated at this time.  Prednisone , Bromfed-DM, zyrtec -D, tussionex and Flonase  were prescribed to manage symptoms.  Supportive care measures, including hydration, rest, and nasal saline irrigation, were discussed. Indications for follow-up or return precautions were reviewed with the patient.  Today's evaluation has revealed no signs of a dangerous process. Discussed diagnosis with patient and/or guardian. Patient and/or guardian aware of their diagnosis, possible red flag symptoms to watch out for and need for close follow up. Patient and/or guardian understands verbal and written discharge instructions. Patient and/or guardian comfortable with plan and disposition.  Patient and/or guardian has a clear mental status at this time, good insight into illness (after discussion and teaching) and has clear judgment to make decisions regarding their care  Documentation was completed with the aid of voice recognition software. Transcription may contain typographical errors.  Final Clinical Impressions(s) / UC Diagnoses   Final diagnoses:  Allergic sinusitis     Discharge Instructions      You have sinus infection. A sinus infection, also called sinusitis, is inflammation of your sinuses. Sinusitis can be due to bacteria, viruses or allergies. Your infection is likely due to allergies from the hay and mowing outside.  Since your illness is caused by allergies, antibiotics won't help because antibiotics only treat infections caused by bacteria. Take medications as prescribed. Make sure you finish  all the prescribed medications even if you start to feel better. You may also take tylenol  and/or ibuprofen  as needed for pain and/or fever. Use Flonase  daily. Drink enough fluid to keep your urine pale yellow. Staying hydrated will also help to thin your mucus. Use a cool mist humidifier to keep the humidity level in your home above 50%. Inhale steam for 10-15 minutes, 3-4 times a day. You can do this in the bathroom while a hot shower is running and/or purchase over-the-counter vapor shower tablets which is great to help with nasal congestion.Try to limit your exposure to cool or dry air. Sleep with your head raised to decrease post-nasal drainage. Make sure you get enough sleep each night .Once you have finished your medication and your symptoms have improved, remember to replace your toothbrush to help prevent re-infection. If your symptoms do not improved after completing medications, please follow-up with your healthcare provider.     ED Prescriptions     Medication Sig Dispense Auth. Provider   cetirizine -pseudoephedrine  (ZYRTEC -D) 5-120 MG tablet Take 1 tablet by mouth daily with breakfast for 10 days. 10 tablet Iola Lukes, FNP   fluticasone  (FLONASE ) 50 MCG/ACT nasal spray Place 2 sprays into both nostrils daily. Shake well before use. Gently blow nose before spraying. Do not blow nose immediately after use. You should not taste the medication or feel it going down your throat; if you do, adjust your technique. 16 g Iola Lukes, FNP   predniSONE  (DELTASONE ) 20 MG tablet Take 2 tablets (  40 mg total) by mouth daily for 5 days. 10 tablet Iola Lukes, FNP   brompheniramine-pseudoephedrine -DM 30-2-10 MG/5ML syrup Take 10 mLs by mouth every 6 (six) hours as needed (cough and congestion). 120 mL Iola Lukes, FNP   chlorpheniramine-HYDROcodone  (TUSSIONEX) 10-8 MG/5ML Take 5 mLs by mouth at bedtime. 35 mL Iola Lukes, FNP      I have reviewed the PDMP during this  encounter.   Iola Lukes, [place] 04/30/24 1217

## 2024-04-30 NOTE — ED Triage Notes (Addendum)
 Since last week has had cough since swallowing a gnat. Reports has sore throat as of today, right now soreness is 2/10. Cough worse at night and is dry. No fever. Has had alkaseltzer plus capsules since yesterday.

## 2024-04-30 NOTE — Discharge Instructions (Signed)
 You have sinus infection. A sinus infection, also called sinusitis, is inflammation of your sinuses. Sinusitis can be due to bacteria, viruses or allergies. Your infection is likely due to allergies from the hay and mowing outside.  Since your illness is caused by allergies, antibiotics won't help because antibiotics only treat infections caused by bacteria. Take medications as prescribed. Make sure you finish all the prescribed medications even if you start to feel better. You may also take tylenol  and/or ibuprofen  as needed for pain and/or fever. Use Flonase  daily. Drink enough fluid to keep your urine pale yellow. Staying hydrated will also help to thin your mucus. Use a cool mist humidifier to keep the humidity level in your home above 50%. Inhale steam for 10-15 minutes, 3-4 times a day. You can do this in the bathroom while a hot shower is running and/or purchase over-the-counter vapor shower tablets which is great to help with nasal congestion.Try to limit your exposure to cool or dry air. Sleep with your head raised to decrease post-nasal drainage. Make sure you get enough sleep each night .Once you have finished your medication and your symptoms have improved, remember to replace your toothbrush to help prevent re-infection. If your symptoms do not improved after completing medications, please follow-up with your healthcare provider.

## 2024-05-01 ENCOUNTER — Telehealth: Payer: Self-pay

## 2024-05-01 NOTE — Telephone Encounter (Signed)
Called to check on patient. No answer. Left voicemail.

## 2024-05-08 ENCOUNTER — Encounter: Payer: Self-pay | Admitting: Sports Medicine

## 2024-07-09 ENCOUNTER — Encounter: Payer: Self-pay | Admitting: Radiology

## 2024-08-14 ENCOUNTER — Encounter: Admitting: Family Medicine

## 2024-08-21 ENCOUNTER — Encounter: Payer: Self-pay | Admitting: Family Medicine

## 2024-08-21 ENCOUNTER — Ambulatory Visit: Admitting: Family Medicine

## 2024-08-21 VITALS — BP 120/82 | Ht 62.0 in | Wt 170.0 lb

## 2024-08-21 DIAGNOSIS — M722 Plantar fascial fibromatosis: Secondary | ICD-10-CM

## 2024-08-21 DIAGNOSIS — M2242 Chondromalacia patellae, left knee: Secondary | ICD-10-CM

## 2024-08-21 NOTE — Progress Notes (Addendum)
 PCP: Willo Mini, NP  Subjective:   HPI: Patient is a 58 y.o. female here for custom orthotic fitting.  She had a most recent fit n run orthotics done in January of 2025.  She had a previous eva blue base glued in 2023 as well.  She states that her most recent pair has done great for tennis shoes but that her previous pair of the EVA base was better for her day-to-day job.  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  Past Medical History:  Diagnosis Date   Hypothyroidism    SVD (spontaneous vaginal delivery)    x 1   Thyroid disease     Medications Ordered Prior to Encounter[1]  BP 120/82   Ht 5' 2 (1.575 m)   Wt 170 lb (77.1 kg)   LMP 07/10/2018   BMI 31.09 kg/m        Objective:   Physical Exam:  Gen: NAD, comfortable in exam room Bilateral feet Inspection: Very mild bunion deformity present bilaterally, maintain long arch and transverse arch, some splaying between 1st and 2nd toes right Palpation: Tenderness to palpation at the insertion of the Achilles into the calcaneus. Gait: Normal Neuro: Intact distally  Assessment/Plan:   Kathleen Huffman is a 58 y.o. female who was seen today for the following: 1. Plantar fasciitis, left (Primary)  2. Chondromalacia of patellofemoral joint, left    Patient was fitted for a standard, cushioned, semi-rigid orthotic.  The fit and run was heated and the patient stood on the orthotic blank positioned on the orthotic stand. The patient was positioned in subtalar neutral position and 10 degrees of ankle dorsiflexion in a weight bearing stance. After molding, a stable [employer] EVA base was applied to the orthotic blank.   The blank was ground to a stable position for weight bearing. Blank: Fit and Run - size 9 base: Blue EVA base posting: none additional orthotic padding: none  - Orthotics fit well and patients gait continues to be normal.  She stated that these immediately felt like the same comfort  that she had on the pair that from 2023.    - She may return for an additional base to be applied to her previous Fit  and Run orthotics that were made January 2025.  There should be no charge for this EVA base being put on her previous Fit and Run orthotics.  She states here daughter will likely bring them in for us  to modify at an upcoming appointment.  She will likely plan for a follow-up for an additional set of orthotics after seeing how this new set from today feels.  Follow-up/Education:   No follow-ups on file.   May return sooner as needed and encouraged to call/e-mail for additional questions or  worsening symptoms in the interim.  Krystal Lowing, DO Sports Medicine Fellow 08/21/2024 11:12 AM     [1]  Current Outpatient Medications on File Prior to Visit  Medication Sig Dispense Refill   acetaminophen  (TYLENOL ) 650 MG CR tablet Take 1 tablet (650 mg total) by mouth every 8 (eight) hours as needed for pain. 90 tablet 3   Ascorbic Acid (VITAMIN C ER PO) Take by mouth.     Bioflavonoid Products (GRAPE SEED PO) Take by mouth.     brompheniramine-pseudoephedrine -DM 30-2-10 MG/5ML syrup Take 10 mLs by mouth every 6 (six) hours as needed (cough and congestion). 120 mL 0   chlorpheniramine-HYDROcodone  (TUSSIONEX) 10-8 MG/5ML Take 5  mLs by mouth at bedtime. 35 mL 0   Cholecalciferol (VITAMIN D3) 5000 units CAPS Take 1 capsule by mouth daily.     co-enzyme Q-10 30 MG capsule Take 30 mg by mouth 3 (three) times daily.     Cyanocobalamin (VITAMIN B-12 PO) Take 1 tablet by mouth 2 (two) times daily.      estradiol  (VIVELLE -DOT) 0.0375 MG/24HR Place 1 patch onto the skin every 3 (three) days.     fluticasone  (FLONASE ) 50 MCG/ACT nasal spray Place 2 sprays into both nostrils daily. Shake well before use. Gently blow nose before spraying. Do not blow nose immediately after use. You should not taste the medication or feel it going down your throat; if you do, adjust your technique. 16 g 0    Menaquinone-7 (VITAMIN K2 PO) Take by mouth.     Omega-3 Fatty Acids (OMEGA 3 PO) Take 1 capsule by mouth daily.     progesterone (PROMETRIUM) 100 MG capsule TAKE 1 CAPSULE BY MOUTH AT BEDTIME STOP FOR MENSES  4   thyroid (ARMOUR) 65 MG tablet Take 32.5-65 mg by mouth 2 (two) times daily. 65 mg in the morning and 32.5 mg at night     No current facility-administered medications on file prior to visit.

## 2024-08-21 NOTE — Patient Instructions (Signed)
 Thank you for coming to see me today. It was a pleasure.   We made you new orthotics. Let us know if we need to add any extra cushioning or make changes.  If you have any questions or concerns, please do not hesitate to call the office at [phone].

## 2024-09-03 ENCOUNTER — Encounter: Payer: BC Managed Care – PPO | Admitting: Medical-Surgical

## 2024-09-11 ENCOUNTER — Encounter: Payer: Self-pay | Admitting: Family Medicine

## 2024-09-17 ENCOUNTER — Ambulatory Visit (INDEPENDENT_AMBULATORY_CARE_PROVIDER_SITE_OTHER): Admitting: Medical-Surgical

## 2024-09-17 ENCOUNTER — Encounter: Payer: Self-pay | Admitting: Medical-Surgical

## 2024-09-17 VITALS — BP 139/72 | HR 72 | Temp 98.1°F | Resp 16 | Ht 63.0 in | Wt 179.1 lb

## 2024-09-17 DIAGNOSIS — Z0000 Encounter for general adult medical examination without abnormal findings: Secondary | ICD-10-CM | POA: Diagnosis not present

## 2024-09-17 DIAGNOSIS — Z636 Dependent relative needing care at home: Secondary | ICD-10-CM | POA: Diagnosis not present

## 2024-09-17 DIAGNOSIS — E039 Hypothyroidism, unspecified: Secondary | ICD-10-CM | POA: Diagnosis not present

## 2024-09-17 MED ORDER — BUSPIRONE HCL 5 MG PO TABS: 5.0000 mg | ORAL_TABLET | Freq: Two times a day (BID) | ORAL | 0 refills | Status: AC

## 2024-09-17 NOTE — Progress Notes (Signed)
 "  Complete physical exam  Patient: Kathleen Huffman   DOB: 08/16/1966   59 y.o. Female  MRN: [id number]  Subjective:    Chief Complaint  Patient presents with   Annual Exam   Kathleen Huffman is a 59 y.o. female who presents today for a complete physical exam. She reports consuming a general diet. The patient has a physically strenuous job, but has no regular exercise apart from work.  She generally feels fairly well. She reports sleeping fairly well. She does have additional problems to discuss today.    Most recent fall risk assessment:    09/17/2024    8:45 AM  Fall Risk   Falls in the past year? 0  Number falls in past yr: 0  Risk for fall due to : No Fall Risks  Follow up Falls evaluation completed     Most recent depression screenings:    09/17/2024    8:46 AM 09/02/2023   11:22 AM  PHQ 2/9 Scores  PHQ - 2 Score 0 0  PHQ- 9 Score 2 4      Data saved with a previous flowsheet row definition    Vision:Within last year and Dental: No current dental problems and Receives regular dental care    Patient Care Team: Willo Mini, NP as PCP - General (Nurse Practitioner) Gorge Ade, MD (Inactive) as Consulting Physician (Obstetrics and Gynecology) Zackary Megan ORN, MD (Family Medicine)   Show/hide medication list[1]  Review of Systems  Constitutional:  Negative for chills, fever, malaise/fatigue and weight loss.  HENT:  Negative for congestion, ear pain, hearing loss, sinus pain and sore throat.   Eyes:  Negative for blurred vision, photophobia and pain.  Respiratory:  Negative for cough, shortness of breath and wheezing.   Cardiovascular:  Negative for chest pain, palpitations and leg swelling.  Gastrointestinal:  Negative for abdominal pain, constipation, diarrhea, heartburn, nausea and vomiting.  Genitourinary:  Negative for dysuria, frequency and urgency.  Musculoskeletal:  Negative for falls and neck pain.  Skin:  Negative for itching and rash.   Neurological:  Negative for dizziness, weakness and headaches.  Endo/Heme/Allergies:  Negative for polydipsia. Does not bruise/bleed easily.  Psychiatric/Behavioral:  Negative for depression, substance abuse and suicidal ideas. The patient is nervous/anxious and has insomnia.      Objective:    BP 139/72   Pulse 72   Temp 98.1 F (36.7 C) (Oral)   Resp 16   Ht 5' 3 (1.6 m)   Wt 179 lb 1.3 oz (81.2 kg)   LMP 07/10/2018   SpO2 97%   BMI 31.72 kg/m    Physical Exam Constitutional:      General: She is not in acute distress.    Appearance: Normal appearance. She is not ill-appearing.  HENT:     Head: Normocephalic and atraumatic.     Right Ear: Tympanic membrane, ear canal and external ear normal. There is no impacted cerumen.     Left Ear: Tympanic membrane, ear canal and external ear normal. There is no impacted cerumen.     Nose: Nose normal. No congestion or rhinorrhea.     Mouth/Throat:     Mouth: Mucous membranes are moist.     Pharynx: No oropharyngeal exudate or posterior oropharyngeal erythema.  Eyes:     General: No scleral icterus.       Right eye: No discharge.        Left eye: No discharge.     Extraocular Movements: Extraocular movements  intact.     Conjunctiva/sclera: Conjunctivae normal.     Pupils: Pupils are equal, round, and reactive to light.  Neck:     Thyroid: No thyromegaly.     Vascular: No carotid bruit or JVD.     Trachea: Trachea normal.  Cardiovascular:     Rate and Rhythm: Normal rate and regular rhythm.     Pulses: Normal pulses.     Heart sounds: Normal heart sounds. No murmur heard.    No friction rub. No gallop.  Pulmonary:     Effort: Pulmonary effort is normal. No respiratory distress.     Breath sounds: Normal breath sounds. No wheezing.  Abdominal:     General: Bowel sounds are normal. There is no distension.     Palpations: Abdomen is soft.     Tenderness: There is no abdominal tenderness. There is no guarding.   Musculoskeletal:        General: Normal range of motion.     Cervical back: Normal range of motion and neck supple.  Lymphadenopathy:     Cervical: No cervical adenopathy.  Skin:    General: Skin is warm and dry.  Neurological:     Mental Status: She is alert and oriented to person, place, and time.     Cranial Nerves: No cranial nerve deficit.  Psychiatric:        Mood and Affect: Mood normal.        Behavior: Behavior normal.        Thought Content: Thought content normal.        Judgment: Judgment normal.   No results found for any visits on 09/17/24.     Assessment & Plan:    Routine Health Maintenance and Physical Exam  Immunization History  Administered Date(s) Administered   Janssen (J&J) SARS-COV-2 Vaccination 12/06/2019   Tdap 04/28/2018    Health Maintenance  Topic Date Due   HIV Screening  Never done   Hepatitis C Screening  Never done   Hepatitis B Vaccines 19-59 Average Risk (1 of 3 - 19+ 3-dose series) Never done   Pneumococcal Vaccine: 50+ Years (1 of 1 - PCV) Never done   COVID-19 Vaccine (2 - 2025-26 season) 05/07/2024   Mammogram  11/07/2025   Cervical Cancer Screening (HPV/Pap Cotest)  11/02/2027   DTaP/Tdap/Td (2 - Td or Tdap) 04/28/2028   Colonoscopy  07/24/2028   HPV VACCINES  Aged Out   Meningococcal B Vaccine  Aged Out   Zoster Vaccines- Shingrix  Discontinued    Discussed health benefits of physical activity, and encouraged her to engage in regular exercise appropriate for her age and condition.  1. Annual physical exam (Primary) Getting labs done tomorrow via [facility]. UTD on preventative care. Wellness information provided with AVS.   2. Hypothyroidism, unspecified type Managed by [facility]. Labs to be done tomorrow.   3. 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 Does not want a daily medication and would like to avoid medications  that have a possibility for addiction. Trialing Buspar  5-15mg  twice daily as needed.   Return in about 1 year (around 09/17/2025) for annual physical exam or sooner if needed.     Zada Palin, NP      [1]  Outpatient Medications Prior to Visit  Medication Sig   acetaminophen  (TYLENOL ) 650 MG CR tablet Take 1 tablet (650 mg total) by mouth every 8 (eight) hours as needed for  pain.   Ascorbic Acid (VITAMIN C ER PO) Take by mouth.   Bioflavonoid Products (GRAPE SEED PO) Take by mouth.   Cholecalciferol (VITAMIN D3) 5000 units CAPS Take 1 capsule by mouth daily.   co-enzyme Q-10 30 MG capsule Take 30 mg by mouth 3 (three) times daily.   Cyanocobalamin (VITAMIN B-12 PO) Take 1 tablet by mouth 2 (two) times daily.    estradiol  (VIVELLE -DOT) 0.0375 MG/24HR Place 1 patch onto the skin every 3 (three) days.   fluticasone  (FLONASE ) 50 MCG/ACT nasal spray Place 2 sprays into both nostrils daily. Shake well before use. Gently blow nose before spraying. Do not blow nose immediately after use. You should not taste the medication or feel it going down your throat; if you do, adjust your technique.   Menaquinone-7 (VITAMIN K2 PO) Take by mouth.   Omega-3 Fatty Acids (OMEGA 3 PO) Take 1 capsule by mouth daily.   progesterone (PROMETRIUM) 100 MG capsule TAKE 1 CAPSULE BY MOUTH AT BEDTIME STOP FOR MENSES   thyroid (ARMOUR) 65 MG tablet Take 32.5-65 mg by mouth 2 (two) times daily. 65 mg in the morning and 32.5 mg at night   brompheniramine-pseudoephedrine -DM 30-2-10 MG/5ML syrup Take 10 mLs by mouth every 6 (six) hours as needed (cough and congestion). (Patient not taking: Reported on 09/17/2024)   chlorpheniramine-HYDROcodone  (TUSSIONEX) 10-8 MG/5ML Take 5 mLs by mouth at bedtime. (Patient not taking: Reported on 09/17/2024)   No facility-administered medications prior to visit.   "

## 2024-09-17 NOTE — Addendum Note (Signed)
 Addended by: FANNY NIELS CROME on: 09/17/2024 09:32 AM   Modules accepted: Orders

## 2024-09-17 NOTE — Patient Instructions (Signed)
 Preventive Care 57-57 Years Old, Female  Preventive care refers to lifestyle choices and visits with your health care provider that can promote health and wellness. Preventive care visits are also called wellness exams.  What can I expect for my preventive care visit?  Counseling  Your health care provider may ask you questions about your:  Medical history, including:  Past medical problems.  Family medical history.  Pregnancy history.  Current health, including:  Menstrual cycle.  Method of birth control.  Emotional well-being.  Home life and relationship well-being.  Sexual activity and sexual health.  Lifestyle, including:  Alcohol, nicotine or tobacco, and drug use.  Access to firearms.  Diet, exercise, and sleep habits.  Work and work Astronomer.  Sunscreen use.  Safety issues such as seatbelt and bike helmet use.  Physical exam  Your health care provider will check your:  Height and weight. These may be used to calculate your BMI (body mass index). BMI is a measurement that tells if you are at a healthy weight.  Waist circumference. This measures the distance around your waistline. This measurement also tells if you are at a healthy weight and may help predict your risk of certain diseases, such as type 2 diabetes and high blood pressure.  Heart rate and blood pressure.  Body temperature.  Skin for abnormal spots.  What immunizations do I need?    Vaccines are usually given at various ages, according to a schedule. Your health care provider will recommend vaccines for you based on your age, medical history, and lifestyle or other factors, such as travel or where you work.  What tests do I need?  Screening  Your health care provider may recommend screening tests for certain conditions. This may include:  Lipid and cholesterol levels.  Diabetes screening. This is done by checking your blood sugar (glucose) after you have not eaten for a while (fasting).  Pelvic exam and Pap test.  Hepatitis B test.  Hepatitis C  test.  HIV (human immunodeficiency virus) test.  STI (sexually transmitted infection) testing, if you are at risk.  Lung cancer screening.  Colorectal cancer screening.  Mammogram. Talk with your health care provider about when you should start having regular mammograms. This may depend on whether you have a family history of breast cancer.  BRCA-related cancer screening. This may be done if you have a family history of breast, ovarian, tubal, or peritoneal cancers.  Bone density scan. This is done to screen for osteoporosis.  Talk with your health care provider about your test results, treatment options, and if necessary, the need for more tests.  Follow these instructions at home:  Eating and drinking    Eat a diet that includes fresh fruits and vegetables, whole grains, lean protein, and low-fat dairy products.  Take vitamin and mineral supplements as recommended by your health care provider.  Do not drink alcohol if:  Your health care provider tells you not to drink.  You are pregnant, may be pregnant, or are planning to become pregnant.  If you drink alcohol:  Limit how much you have to 0-1 drink a day.  Know how much alcohol is in your drink. In the [place] one drink equals one 12 oz bottle of beer (355 mL), one 5 oz glass of wine (148 mL), or one 1 oz glass of hard liquor (44 mL).  Lifestyle  Brush your teeth every morning and night with fluoride toothpaste. Floss one time each day.  Exercise for at least  30 minutes 5 or more days each week.  Do not use any products that contain nicotine or tobacco. These products include cigarettes, chewing tobacco, and vaping devices, such as e-cigarettes. If you need help quitting, ask your health care provider.  Do not use drugs.  If you are sexually active, practice safe sex. Use a condom or other form of protection to prevent STIs.  If you do not wish to become pregnant, use a form of birth control. If you plan to become pregnant, see your health care provider for a  prepregnancy visit.  Take aspirin only as told by your health care provider. Make sure that you understand how much to take and what form to take. Work with your health care provider to find out whether it is safe and beneficial for you to take aspirin daily.  Find healthy ways to manage stress, such as:  Meditation, yoga, or listening to music.  Journaling.  Talking to a trusted person.  Spending time with friends and family.  Minimize exposure to UV radiation to reduce your risk of skin cancer.  Safety  Always wear your seat belt while driving or riding in a vehicle.  Do not drive:  If you have been drinking alcohol. Do not ride with someone who has been drinking.  When you are tired or distracted.  While texting.  If you have been using any mind-altering substances or drugs.  Wear a helmet and other protective equipment during sports activities.  If you have firearms in your house, make sure you follow all gun safety procedures.  Seek help if you have been physically or sexually abused.  What's next?  Visit your health care provider once a year for an annual wellness visit.  Ask your health care provider how often you should have your eyes and teeth checked.  Stay up to date on all vaccines.  This information is not intended to replace advice given to you by your health care provider. Make sure you discuss any questions you have with your health care provider.  Document Revised: 02/18/2021 Document Reviewed: 02/18/2021  Elsevier Patient Education  2024 [employer].

## 2025-09-18 ENCOUNTER — Encounter: Admitting: Medical-Surgical
